# Patient Record
Sex: Female | Born: 1955 | Hispanic: No | Marital: Single | Smoking: Current every day smoker
Health system: Southern US, Community
[De-identification: ages and names within clinical notes are randomized; demographics above are authoritative.]

## PROBLEM LIST (undated history)

## (undated) DIAGNOSIS — J019 Acute sinusitis, unspecified: Secondary | ICD-10-CM

## (undated) DIAGNOSIS — D239 Other benign neoplasm of skin, unspecified: Secondary | ICD-10-CM

## (undated) DIAGNOSIS — R5381 Other malaise: Secondary | ICD-10-CM

## (undated) DIAGNOSIS — I10 Essential (primary) hypertension: Secondary | ICD-10-CM

## (undated) DIAGNOSIS — F17200 Nicotine dependence, unspecified, uncomplicated: Secondary | ICD-10-CM

## (undated) DIAGNOSIS — L578 Other skin changes due to chronic exposure to nonionizing radiation: Secondary | ICD-10-CM

## (undated) DIAGNOSIS — R7611 Nonspecific reaction to tuberculin skin test without active tuberculosis: Secondary | ICD-10-CM

## (undated) DIAGNOSIS — R5383 Other fatigue: Secondary | ICD-10-CM

## (undated) DIAGNOSIS — F4323 Adjustment disorder with mixed anxiety and depressed mood: Secondary | ICD-10-CM

## (undated) HISTORY — DX: Nicotine dependence, unspecified, uncomplicated: F17.200

## (undated) HISTORY — DX: Adjustment disorder with mixed anxiety and depressed mood: F43.23

## (undated) HISTORY — DX: Other benign neoplasm of skin, unspecified: D23.9

## (undated) HISTORY — DX: Essential (primary) hypertension: I10

## (undated) HISTORY — DX: Nonspecific reaction to tuberculin skin test without active tuberculosis: R76.11

## (undated) HISTORY — DX: Other malaise: R53.81

## (undated) HISTORY — DX: Other skin changes due to chronic exposure to nonionizing radiation: L57.8

## (undated) HISTORY — DX: Other fatigue: R53.83

## (undated) HISTORY — DX: Acute sinusitis, unspecified: J01.90

---

## 1985-01-02 DIAGNOSIS — R7611 Nonspecific reaction to tuberculin skin test without active tuberculosis: Secondary | ICD-10-CM

## 1985-01-02 HISTORY — PX: ABDOMINAL HYSTERECTOMY: SHX81

## 1985-01-02 HISTORY — DX: Nonspecific reaction to tuberculin skin test without active tuberculosis: R76.11

## 2000-06-11 ENCOUNTER — Encounter: Payer: Self-pay | Admitting: Pulmonary Disease

## 2000-09-28 ENCOUNTER — Encounter: Payer: Self-pay | Admitting: Pulmonary Disease

## 2009-06-18 ENCOUNTER — Ambulatory Visit: Payer: Self-pay | Admitting: Family Medicine

## 2009-06-18 DIAGNOSIS — J019 Acute sinusitis, unspecified: Secondary | ICD-10-CM

## 2009-06-18 DIAGNOSIS — I10 Essential (primary) hypertension: Secondary | ICD-10-CM | POA: Insufficient documentation

## 2009-06-22 DIAGNOSIS — R0609 Other forms of dyspnea: Secondary | ICD-10-CM

## 2009-06-22 DIAGNOSIS — G4710 Hypersomnia, unspecified: Secondary | ICD-10-CM

## 2009-06-22 DIAGNOSIS — R0989 Other specified symptoms and signs involving the circulatory and respiratory systems: Secondary | ICD-10-CM

## 2009-06-24 ENCOUNTER — Telehealth (INDEPENDENT_AMBULATORY_CARE_PROVIDER_SITE_OTHER): Payer: Self-pay | Admitting: Family Medicine

## 2009-07-30 ENCOUNTER — Ambulatory Visit: Payer: Self-pay | Admitting: Family Medicine

## 2009-07-30 DIAGNOSIS — R5383 Other fatigue: Secondary | ICD-10-CM

## 2009-07-30 DIAGNOSIS — D239 Other benign neoplasm of skin, unspecified: Secondary | ICD-10-CM | POA: Insufficient documentation

## 2009-07-30 DIAGNOSIS — R5381 Other malaise: Secondary | ICD-10-CM

## 2009-07-30 DIAGNOSIS — F4323 Adjustment disorder with mixed anxiety and depressed mood: Secondary | ICD-10-CM

## 2009-07-30 DIAGNOSIS — F17200 Nicotine dependence, unspecified, uncomplicated: Secondary | ICD-10-CM

## 2009-07-30 DIAGNOSIS — L578 Other skin changes due to chronic exposure to nonionizing radiation: Secondary | ICD-10-CM | POA: Insufficient documentation

## 2009-08-02 ENCOUNTER — Encounter: Payer: Self-pay | Admitting: Family Medicine

## 2009-08-02 LAB — CONVERTED CEMR LAB
BUN: 19 mg/dL (ref 6–23)
Basophils Absolute: 0 10*3/uL (ref 0.0–0.1)
Basophils Relative: 0.4 % (ref 0.0–3.0)
CO2: 28 meq/L (ref 19–32)
Calcium: 10.2 mg/dL (ref 8.4–10.5)
Chloride: 96 meq/L (ref 96–112)
Cholesterol: 220 mg/dL — ABNORMAL HIGH (ref 0–200)
Creatinine, Ser: 0.9 mg/dL (ref 0.4–1.2)
Direct LDL: 107.2 mg/dL
Eosinophils Absolute: 0.1 10*3/uL (ref 0.0–0.7)
Eosinophils Relative: 0.6 % (ref 0.0–5.0)
GFR calc non Af Amer: 72 mL/min (ref >60)
Glucose, Bld: 104 mg/dL — ABNORMAL HIGH (ref 70–99)
HCT: 41.3 % (ref 36.0–46.0)
HDL: 92.4 mg/dL (ref >39.00)
Hemoglobin: 14 g/dL (ref 12.0–15.0)
Lymphocytes Relative: 16.2 % (ref 12.0–46.0)
Lymphs Abs: 1.7 10*3/uL (ref 0.7–4.0)
MCHC: 33.9 g/dL (ref 30.0–36.0)
MCV: 93.3 fL (ref 78.0–100.0)
Monocytes Absolute: 0.7 10*3/uL (ref 0.1–1.0)
Monocytes Relative: 7.1 % (ref 3.0–12.0)
Neutro Abs: 7.8 10*3/uL — ABNORMAL HIGH (ref 1.4–7.7)
Neutrophils Relative %: 75.7 % (ref 43.0–77.0)
Platelets: 316 10*3/uL (ref 150.0–400.0)
Potassium: 3.9 meq/L (ref 3.5–5.1)
RBC: 4.43 M/uL (ref 3.87–5.11)
RDW: 14.2 % (ref 11.5–14.6)
Sodium: 130 meq/L — ABNORMAL LOW (ref 135–145)
TSH: 0.56 microintl units/mL (ref 0.35–5.50)
Total CHOL/HDL Ratio: 2
Triglycerides: 154 mg/dL — ABNORMAL HIGH (ref 0.0–149.0)
VLDL: 30.8 mg/dL (ref 0.0–40.0)
WBC: 10.3 10*3/uL (ref 4.5–10.5)

## 2009-08-04 ENCOUNTER — Ambulatory Visit: Payer: Self-pay | Admitting: Family Medicine

## 2009-08-05 LAB — CONVERTED CEMR LAB
BUN: 10 mg/dL (ref 6–23)
CO2: 29 meq/L (ref 19–32)
Calcium: 9.8 mg/dL (ref 8.4–10.5)
Chloride: 93 meq/L — ABNORMAL LOW (ref 96–112)
Creatinine, Ser: 0.6 mg/dL (ref 0.4–1.2)
GFR calc non Af Amer: 112.72 mL/min (ref >60)
Glucose, Bld: 91 mg/dL (ref 70–99)
Potassium: 3.6 meq/L (ref 3.5–5.1)
Sodium: 134 meq/L — ABNORMAL LOW (ref 135–145)

## 2009-08-17 ENCOUNTER — Telehealth: Payer: Self-pay | Admitting: Family Medicine

## 2009-08-18 ENCOUNTER — Ambulatory Visit: Payer: Self-pay | Admitting: Pulmonary Disease

## 2009-08-18 DIAGNOSIS — G4733 Obstructive sleep apnea (adult) (pediatric): Secondary | ICD-10-CM | POA: Insufficient documentation

## 2009-08-24 ENCOUNTER — Encounter: Payer: Self-pay | Admitting: Family Medicine

## 2009-08-24 ENCOUNTER — Ambulatory Visit: Payer: Self-pay | Admitting: Family Medicine

## 2009-08-26 LAB — HM MAMMOGRAPHY: HM Mammogram: NORMAL

## 2009-09-15 ENCOUNTER — Telehealth: Payer: Self-pay | Admitting: Family Medicine

## 2009-09-24 ENCOUNTER — Telehealth (INDEPENDENT_AMBULATORY_CARE_PROVIDER_SITE_OTHER): Payer: Self-pay | Admitting: *Deleted

## 2009-09-24 ENCOUNTER — Ambulatory Visit: Payer: Self-pay | Admitting: Pulmonary Disease

## 2009-09-27 ENCOUNTER — Telehealth: Payer: Self-pay | Admitting: Family Medicine

## 2009-09-27 DIAGNOSIS — R197 Diarrhea, unspecified: Secondary | ICD-10-CM | POA: Insufficient documentation

## 2009-10-13 ENCOUNTER — Telehealth: Payer: Self-pay | Admitting: Family Medicine

## 2009-11-22 ENCOUNTER — Telehealth: Payer: Self-pay | Admitting: Family Medicine

## 2009-11-30 ENCOUNTER — Encounter: Payer: Self-pay | Admitting: Family Medicine

## 2009-12-06 ENCOUNTER — Ambulatory Visit: Payer: Self-pay | Admitting: Family Medicine

## 2009-12-13 ENCOUNTER — Ambulatory Visit: Payer: Self-pay | Admitting: Family Medicine

## 2009-12-13 ENCOUNTER — Encounter: Payer: Self-pay | Admitting: Family Medicine

## 2009-12-13 DIAGNOSIS — K219 Gastro-esophageal reflux disease without esophagitis: Secondary | ICD-10-CM | POA: Insufficient documentation

## 2009-12-28 LAB — CONVERTED CEMR LAB
BUN: 17 mg/dL (ref 6–23)
Basophils Absolute: 0 10*3/uL (ref 0.0–0.1)
CO2: 27 meq/L (ref 19–32)
Chloride: 100 meq/L (ref 96–112)
Cholesterol: 252 mg/dL — ABNORMAL HIGH (ref 0–200)
Creatinine, Ser: 0.8 mg/dL (ref 0.4–1.2)
Direct LDL: 122.5 mg/dL
Eosinophils Relative: 1.1 % (ref 0.0–5.0)
HCT: 40.4 % (ref 36.0–46.0)
Hemoglobin: 13.9 g/dL (ref 12.0–15.0)
Lymphocytes Relative: 14.3 % (ref 12.0–46.0)
Lymphs Abs: 1.4 10*3/uL (ref 0.7–4.0)
Monocytes Relative: 5.9 % (ref 3.0–12.0)
Platelets: 333 10*3/uL (ref 150.0–400.0)
Potassium: 4.3 meq/L (ref 3.5–5.1)
RDW: 14.1 % (ref 11.5–14.6)
Total CHOL/HDL Ratio: 3
VLDL: 20 mg/dL (ref 0.0–40.0)
WBC: 10.1 10*3/uL (ref 4.5–10.5)

## 2010-01-04 ENCOUNTER — Telehealth: Payer: Self-pay | Admitting: Family Medicine

## 2010-01-10 ENCOUNTER — Other Ambulatory Visit: Payer: Self-pay | Admitting: Family Medicine

## 2010-01-10 ENCOUNTER — Ambulatory Visit
Admission: RE | Admit: 2010-01-10 | Discharge: 2010-01-10 | Payer: Self-pay | Source: Home / Self Care | Attending: Family Medicine | Admitting: Family Medicine

## 2010-01-11 LAB — BASIC METABOLIC PANEL
BUN: 22 mg/dL (ref 6–23)
CO2: 28 mEq/L (ref 19–32)
Calcium: 9.7 mg/dL (ref 8.4–10.5)
Chloride: 99 mEq/L (ref 96–112)
Creatinine, Ser: 0.8 mg/dL (ref 0.4–1.2)
GFR: 74.85 mL/min (ref >60.00)
Glucose, Bld: 95 mg/dL (ref 70–99)
Potassium: 4.1 mEq/L (ref 3.5–5.1)
Sodium: 137 mEq/L (ref 135–145)

## 2010-01-11 LAB — B12 AND FOLATE PANEL
Folate: 2.6 ng/mL
Vitamin B-12: 289 pg/mL (ref 211–911)

## 2010-01-11 LAB — FOLLICLE STIMULATING HORMONE: FSH: 41.8 m[IU]/mL

## 2010-01-11 LAB — LUTEINIZING HORMONE: LH: 29.9 m[IU]/mL

## 2010-01-11 LAB — TSH: TSH: 0.83 u[IU]/mL (ref 0.35–5.50)

## 2010-01-31 ENCOUNTER — Encounter: Payer: Self-pay | Admitting: Family Medicine

## 2010-01-31 ENCOUNTER — Ambulatory Visit: Payer: Self-pay | Admitting: Unknown Physician Specialty

## 2010-02-01 NOTE — Medication Information (Signed)
Summary: CPAP Titration/Our [facility]  CPAP Titration/Our [facility]   Imported By: Sherian Rein 09/02/2009 11:58:58  _____________________________________________________________________  External Attachment:    Type:   Image     Comment:   External Document

## 2010-02-01 NOTE — Progress Notes (Signed)
Summary: faxed request for alprazolam  Phone Note Refill Request   Refills Requested: Medication #1:  ALPRAZOLAM 0.25 MG TABS 1 tab by mouth three times a day as needed anxiety.   Last Refilled: 09/15/2009 Faxed request from [place], [phone]  Initial call taken by: Lowella Petties CMA,  October 13, 2009 2:46 PM  Follow-up for Phone Call        Rx called to pharmacy Follow-up by: Linde Gillis CMA Duncan Dull),  October 14, 2009 9:07 AM    Prescriptions: ALPRAZOLAM 0.25 MG TABS (ALPRAZOLAM) 1 tab by mouth three times a day as needed anxiety.  #60 x 0   Entered and Authorized by:   Ruthe Mannan MD   Signed by:   Ruthe Mannan MD on 10/14/2009   Method used:   Telephoned to ...       [facility] [address] (retail)       [address], [address]       [place], [place]  [address]       Ph: [phone]       Fax: [phone]   RxID:   [id number]

## 2010-02-01 NOTE — Assessment & Plan Note (Signed)
Summary: FOLLOW-UP, TALK ABOUT B/P MEDS AND LABS/JRR   Vital Signs:  Patient profile:   55 year old female Height:      70 inches Weight:      186.75 pounds BMI:     26.89 Temp:     98.0 degrees F oral Pulse rate:   72 / minute Pulse rhythm:   regular BP sitting:   150 / 90  (left arm) Cuff size:   regular  Vitals Entered By: Linde Gillis CMA Duncan Dull) (December 06, 2009 3:43 PM) CC: discuss blood pressure medication and labs   History of Present Illness: 55 yo here to discuss:  HTN- Has been on HCTZ/Lisinopril for years.  No CP, SOB, LE edema.  No CP or blurred vision but over past several months, BP has been increasing.  Was 130/90 in 07/2009. Was in 150s- 160s/90s for past several months. Has also been having a dry cough and wants to try something else than ACEI to see if that could be related. She is a chronic smoker as well.     Tobacco abuse- has smoked 1 ppd x 40 years.  Never tried to quit and is not ready to quit yet.    Current Medications (verified): 1)  Ibuprofen 800 Mg Tabs (Ibuprofen) .... Take One To Two Tablets By Mouth As Needed 2)  Premarin 0.9 Mg Tabs (Estrogens Conjugated) .... Take One Tablet By Mouth Daily 3)  Fluoxetine Hcl 20 Mg Caps (Fluoxetine Hcl) .... Take 3 Tabs By Mouth Daily 4)  Vesicare 5 Mg Tabs (Solifenacin Succinate) .... Take One Tablet By Mouth Daily 5)  Aspir-Low 81 Mg Tbec (Aspirin) .... Take One Tablet By Mouth Daily 6)  Alprazolam 0.25 Mg Tabs (Alprazolam) .Marland Kitchen.. 1 Tab By Mouth Three Times A Day As Needed Anxiety. 7)  Nuvigil 50 Mg Tabs (Armodafinil) .... Take One Tablet By Mouth Daily 8)  Omeprazole 20 Mg Cpdr (Omeprazole) .... Take One Tablet By Mouth Daily 9)  Amlodipine Besylate 5 Mg Tabs (Amlodipine Besylate) .Marland Kitchen.. 1 Tab By Mouth Daily. 10)  Hydrochlorothiazide 25 Mg  Tabs (Hydrochlorothiazide) .... Take 1 Tab By Mouth Every Morning  Allergies (verified): No Known Drug Allergies  Review of Systems      See HPI General:  Denies  malaise. Eyes:  Denies blurring. CV:  Denies chest pain or discomfort.  Physical Exam  General:  Well-developed,well-nourished,in no acute distress; alert,appropriate and cooperative throughout examination hypertensive Eyes:  vision grossly intact, pupils equal, pupils round, and pupils reactive to light.   Mouth:  good dentition.   Lungs:  Normal respiratory effort, chest expands symmetrically. Lungs are clear to auscultation, no crackles or wheezes. Heart:  Normal rate and regular rhythm. S1 and S2 normal without gallop, murmur, click, rub or other extra sounds. Extremities:  No clubbing, cyanosis, edema, or deformity noted with normal full range of motion of all joints.   Psych:  Oriented X3, memory intact for recent and remote, good eye contact, not anxious appearing, and not depressed appearing.     Impression & Recommendations:  Problem # 1:  HYPERTENSION (ICD-401.9) Assessment Deteriorated Will d/c ACEI due to cough. Restart HCTZ 25 mg and add amlodipine 5 mg. Check BMET, follow up in one month. The following medications were removed from the medication list:    Lisinopril-hydrochlorothiazide 20-25 Mg Tabs (Lisinopril-hydrochlorothiazide) .Marland Kitchen... Take one tablet by mouth daily Her updated medication list for this problem includes:    Amlodipine Besylate 5 Mg Tabs (Amlodipine besylate) .Marland Kitchen... 1 tab  by mouth daily.    Hydrochlorothiazide 25 Mg Tabs (Hydrochlorothiazide) .Marland Kitchen... Take 1 tab by mouth every morning  Complete Medication List: 1)  Ibuprofen 800 Mg Tabs (Ibuprofen) .... Take one to two tablets by mouth as needed 2)  Premarin 0.9 Mg Tabs (Estrogens conjugated) .... Take one tablet by mouth daily 3)  Fluoxetine Hcl 20 Mg Caps (Fluoxetine hcl) .... Take 3 tabs by mouth daily 4)  Vesicare 5 Mg Tabs (Solifenacin succinate) .... Take one tablet by mouth daily 5)  Aspir-low 81 Mg Tbec (Aspirin) .... Take one tablet by mouth daily 6)  Alprazolam 0.25 Mg Tabs (Alprazolam) .Marland Kitchen.. 1  tab by mouth three times a day as needed anxiety. 7)  Nuvigil 50 Mg Tabs (Armodafinil) .... Take one tablet by mouth daily 8)  Omeprazole 20 Mg Cpdr (Omeprazole) .... Take one tablet by mouth daily 9)  Amlodipine Besylate 5 Mg Tabs (Amlodipine besylate) .Marland Kitchen.. 1 tab by mouth daily. 10)  Hydrochlorothiazide 25 Mg Tabs (Hydrochlorothiazide) .... Take 1 tab by mouth every morning  Patient Instructions: 1)  Please stop taking the Lisinopril-HCTZ pill and start taking the HCTZ and Amlodipine. 2)  Follow up with me in one month. 3)  Schedule a fasting lab visit for labs: 4)  H. Pylori IgG (530.81), BMET (401.9), fasting lipid (V81.0), CBC (780.79) Prescriptions: HYDROCHLOROTHIAZIDE 25 MG  TABS (HYDROCHLOROTHIAZIDE) Take 1 tab by mouth every morning  #90 x 3   Entered and Authorized by:   Ruthe Mannan MD   Signed by:   Ruthe Mannan MD on 12/06/2009   Method used:   Faxed to ...       [facility] ([facility]order)       [address] [place], [place]  [address]       Ph: [phone]       Fax: [phone]   RxID:   [id number] HYDROCHLOROTHIAZIDE 25 MG  TABS (HYDROCHLOROTHIAZIDE) Take 1 tab by mouth every morning  #90 x 3   Entered and Authorized by:   Ruthe Mannan MD   Signed by:   Ruthe Mannan MD on 12/06/2009   Method used:   Electronically to        [facility] [address] (retail)       [address]       [place], [place]  [address]       Ph: [phone]       Fax: [phone]   RxID:   [id number] AMLODIPINE BESYLATE 5 MG TABS (AMLODIPINE BESYLATE) 1 tab by mouth daily.  #30 x 3   Entered and Authorized by:   Ruthe Mannan MD   Signed by:   Ruthe Mannan MD on 12/06/2009   Method used:   Electronically to        [facility] [address]ail[facility]       [address], [address]       [place], [place]  [address]       Ph: [phone]       Fax: [phone]   RxID:   [id number] AMLODIPINE BESYLATE  5 MG TABS (AMLODIPINE BESYLATE) 1 tab by mouth daily.  #90 x 3   Entered and Authorized by:   Ruthe Mannan MD   Signed by:   Ruthe Mannan MD on 12/06/2009   Method used:   Faxed to [facility] (mail-order)       [address] [place], [place]  [address]       Ph: [phone]       Fax: [phone]   RxID:   [id number]    Orders Added: 1)  Est. Patient Level IV [84132]    Current Allergies (reviewed today): No known allergies   Appended Document: referral to sleep lab

## 2010-02-01 NOTE — Progress Notes (Signed)
Summary: refill request for alprazolam  Phone Note Refill Request Message from:  Fax from Pharmacy  Refills Requested: Medication #1:  ALPRAZOLAM 0.25 MG TABS 1 tab by mouth three times a day as needed anxiety..   Last Refilled: 07/30/2009 Faxed request from [place], [phone].  Initial call taken by: Lowella Petties CMA,  August 17, 2009 8:17 AM  Follow-up for Phone Call        Rx called to pharmacy Follow-up by: Linde Gillis CMA Duncan Dull),  August 17, 2009 9:48 AM    Prescriptions: ALPRAZOLAM 0.25 MG TABS (ALPRAZOLAM) 1 tab by mouth three times a day as needed anxiety.  #60 x 0   Entered and Authorized by:   Ruthe Mannan MD   Signed by:   Ruthe Mannan MD on 08/17/2009   Method used:   Telephoned to ...         RxID:   [id number]

## 2010-02-01 NOTE — Progress Notes (Signed)
Summary: F/u sinusitis  Phone Note Call from Patient   Caller: Patient Summary of Call: Sherry Beard called states she was seen Frday a week ago and was given medication, took the medication and was feeling better but now the symptoms of a sinus infection are coming back.    Initial call taken by: Rosine Beat,  June 24, 2009 11:02 AM  Follow-up for Phone Call        Will call in Abx for patient.     New/Updated Medications: AMOXICILLIN 500 MG TABS (AMOXICILLIN) 1 by mouth two times a day x 10 days for infection Prescriptions: AMOXICILLIN 500 MG TABS (AMOXICILLIN) 1 by mouth two times a day x 10 days for infection  #20 x 0   Entered and Authorized by:   Tacey Ruiz MD   Signed by:   Tacey Ruiz MD on 06/24/2009   Method used:   Electronically to        [facility] [address]ail)       [address]       [place], [place]  [address]       Ph: [phone]       Fax: [phone]   RxID:   [id number]

## 2010-02-01 NOTE — Assessment & Plan Note (Signed)
Summary: consult for management of osa   Copy to:  Sherry Beard Primary Sherry Beard/Referring Sherry Beard:  Sherry Mannan MD  CC:  Sleep Consult.  History of Present Illness: The pt is a 55 y/o female who I have been asked to see for management of osa.  She was diagnosed in 05-22-2000 while living in [place] with mild osa.  Her AHI was 14/hr, and her optimal cpap was found to be 7cm.  She did feel that she slept better on the device, but it was a major aggrevation to her.  She c/o significant mask leaks, but pressure was not an issue.  Her machine was left behind in [place], but was almost 55y/o anyway.  Currently, she has loud snoring, but no one has commented on pauses in her breathing during sleep.  She denies choking arousals.  She goes to bed btw 9-11pm, and arises at 8-10am.  She is unrested at least 50% of the time.  She also has frequent awakenings during the night.  She admits to sleep pressure with periods of inactivity, and takes a nap everyday.  On some days she sleeps quite a bit.  She denies any sleepiness issues with driving.  The pt states that her weight is up about 30 pounds the last 12mos.  Her epworth score today is normal at 8.  Current Medications (verified): 1)  Ibuprofen 800 Mg Tabs (Ibuprofen) .... Take One To Two Tablets By Mouth As Needed 2)  Lisinopril-Hydrochlorothiazide 20-25 Mg Tabs (Lisinopril-Hydrochlorothiazide) .... Take One Tablet By Mouth Daily 3)  Premarin 0.9 Mg Tabs (Estrogens Conjugated) .... Take One Tablet By Mouth Daily 4)  Fluoxetine Hcl 20 Mg Caps (Fluoxetine Hcl) .... Take 3 Tabs By Mouth Daily 5)  Vesicare 5 Mg Tabs (Solifenacin Succinate) .... Take One Tablet By Mouth Daily 6)  Aspir-Low 81 Mg Tbec (Aspirin) .... Take One Tablet By Mouth Daily 7)  Alprazolam 0.25 Mg Tabs (Alprazolam) .Marland Kitchen.. 1 Tab By Mouth Three Times A Day As Needed Anxiety.  Allergies (verified): No Known Drug Allergies  Past History:  Past Medical History:   OSA--AHI 14/hr 2000-05-22. 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 Hysterectomy (12/03/2006) benign  Family History: Reviewed history from 07/30/2009 and no changes required. Mom died of 68 of Melanoma Dad alive- HTN, HLD, heart disease  Social History: Reviewed history from 07/30/2009 and no changes required. Current Smoker -1 ppd.  started at age 22. Moved here from [place]. Daughter died of drug OD in 2006-05-23. 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   Review of Systems       The patient complains of shortness of breath with activity, productive cough, weight change, tooth/dental problems, sneezing, anxiety, and depression.  The patient denies shortness of breath at rest, non-productive cough, coughing up blood, chest pain, irregular heartbeats, acid heartburn, indigestion, loss of appetite, abdominal pain, difficulty swallowing, sore throat, headaches, nasal congestion/difficulty breathing through nose, itching, ear ache, hand/feet swelling, joint stiffness or pain, rash, change in color of mucus, and fever.    Vital Signs:  Patient profile:   55 year old female Height:      70 inches Weight:      180 pounds BMI:     25.92 O2 Sat:      97 % on  Room air Temp:     98.5 degrees F oral Pulse rate:   65 / minute BP sitting:   122 / 78  (left arm) Cuff size:   large  Vitals Entered By: Sherry Filter LPN (August 18, 2009 10:10 AM)  O2 Flow:  Room air CC: Sleep Consult Comments Medications reviewed with patient Sherry Filter LPN  August 18, 2009 10:10 AM    Physical Exam  General:  ow female in nad Eyes:  PERRLA and EOMI.   Nose:  narrowed bilat, but not obstructed Mouth:  moderate elongation of soft palate and  uvula Neck:  no jvd, tmg, LN Lungs:  totally clear to auscultation Heart:  rrr, no mrg Abdomen:  soft and nontender, bs+ Extremities:  no edema noted, pulses intact distally no cyanosis  Neurologic:  alert and oriented, moves all 4.   Impression & Recommendations:  Problem # 1:  OBSTRUCTIVE SLEEP APNEA (ICD-327.23) the pt has a history of mild to moderate osa, and has actually gained 30 pounds since that study.  Her degree of sleep apnea is more than likely worse.  She is clearly symptomatic off treatment, and is willing to try cpap again.  I think we have to do a better job with mask fitting, since that seemed to be the "deal breaker" last time.  I have also discussed with her the alternatives such as upper airway surgery and dental appliance.  I would favor the latter if cpap doesn't work out.  I have also encouraged her to work on weight loss.    Medications Added to Medication List This Visit: 1)  Fluoxetine Hcl 20 Mg Caps (Fluoxetine hcl) .... Take 3 tabs by mouth daily  Other Orders: Consultation Level IV ([address]) DME Referral (DME)  Patient Instructions: 1)  will set you up on cpap 2)  work on weight loss. 3)  followup with me in 5 weeks.

## 2010-02-01 NOTE — Progress Notes (Signed)
Summary: GI tests?-LMTCB x 2  Phone Note Call from Patient Call back at Home Phone [phone]   Caller: Patient Call For: clance Summary of Call: pt states that she was told that someone would set up "upper and lower GI tests" for her. (pt was seen today) Initial call taken by: Tivis Ringer, CNA,  September 24, 2009 4:13 PM  Follow-up for Phone Call        pt states that she discussed with Dr. Shelle Iron about him ordering an "upper and lower GI" test because she has met her deductible on her insurance. PT states that [facility] had said he would send order to [facility]. Pelase advise. Carron Curie CMA  September 24, 2009 4:35 PM   Additional Follow-up for Phone Call Additional follow up Details #1::        we did NOT discuss gi evaluation.  She told me she had a tickling cough, and I told her she needed to talk to her primary about coming off lisinopril.  She could also try chlorpheniramine for PND.  nothing was discussed about gi anything!!! she needs to see her primary!!! Additional Follow-up by: Barbaraann Share MD,  September 24, 2009 5:25 PM    Additional Follow-up for Phone Call Additional follow up Details #2::    LMTCBx1. to advise pt needs to call PMD. Carron Curie [facility]  September 24, 2009 5:30 PM  LMOMTCB Vernie Murders  September 27, 2009 10:42 AM    Additional Follow-up for Phone Call Additional follow up Details #3:: Details for Additional Follow-up Action Taken: Spoke with pt and advised that she needs to discuss GI issues with her PCP.  Pt verbalized understanding. Additional Follow-up by: Vernie Murders,  September 27, 2009 10:57 AM

## 2010-02-01 NOTE — Progress Notes (Signed)
Summary: refill request for alprazolam  Phone Note Refill Request Message from:  Fax from Pharmacy  Refills Requested: Medication #1:  ALPRAZOLAM 0.25 MG TABS 1 tab by mouth three times a day as needed anxiety..   Last Refilled: 08/17/2009 Faxed request from [place].  Initial call taken by: Lowella Petties CMA,  September 15, 2009 10:02 AM  Follow-up for Phone Call        Rx called to [facility]. Follow-up by: Linde Gillis CMA Duncan Dull),  September 15, 2009 10:13 AM    Prescriptions: ALPRAZOLAM 0.25 MG TABS (ALPRAZOLAM) 1 tab by mouth three times a day as needed anxiety.  #60 x 0   Entered and Authorized by:   Ruthe Mannan MD   Signed by:   Ruthe Mannan MD on 09/15/2009   Method used:   Telephoned to ...       [facility] [address] (retail)       [address], [address]       [place], [place]  [address]       Ph: [phone]       Fax: [phone]   RxID:   [id number]

## 2010-02-01 NOTE — Assessment & Plan Note (Signed)
Summary: SINUS INFECTION/JBB   Vital Signs:  Patient Profile:   55 Years Old Female CC:      Sinus pain, With headache. / RWT Height:     68 inches Weight:      181 pounds BMI:     27.62 O2 Sat:      98 % O2 treatment:    Room Air Temp:     98.1 degrees F oral Pulse rate:   77 / minute Pulse rhythm:   regular Resp:     18 per minute BP sitting:   159 / 88  (left arm)  Pt. in pain?   yes    Location:   head    Intensity:   6    Type:       aching  Vitals Entered By: Levonne Spiller EMT-P (June 18, 2009 3:20 PM)              Is Patient Diabetic? No Comments Pt. is a smoker.1 pack per day.      Current Allergies: No known allergies History of Present Illness Chief Complaint: Sinus pain, With headache. / RWT History of Present Illness: 3 days patient has been having pain in her upper and lower jaw and R ear. Her nose has been running a lot and she has a dry hacking cough. She last went to the dentist about 2 weeks ago and had no problems then. No f/c.   She also complains of headaches and has episodes of tingling down both arms. No neck pain. Reports that she has been gardening more lately.  REVIEW OF SYSTEMS Constitutional Symptoms      Denies fever, chills, night sweats, weight loss, weight gain, and fatigue.  Eyes       Denies change in vision, eye pain, eye discharge, glasses, contact lenses, and eye surgery. Ear/Nose/Throat/Mouth       Complains of ear pain, frequent runny nose, sinus problems, and tooth pain or bleeding.      Denies hearing loss/aids, change in hearing, ear discharge, dizziness, frequent nose bleeds, sore throat, and hoarseness.  Respiratory       Complains of dry cough.      Denies productive cough, wheezing, shortness of breath, asthma, bronchitis, and emphysema/COPD.  Cardiovascular       Denies murmurs, chest pain, and tires easily with exhertion.    Gastrointestinal       Denies stomach pain, nausea/vomiting, diarrhea, constipation, blood in  bowel movements, and indigestion. Genitourniary       Denies painful urination, kidney stones, and loss of urinary control. Neurological       Complains of headaches and tingling.      Denies paralysis, seizures, and fainting/blackouts. Musculoskeletal       Denies muscle pain, joint pain, joint stiffness, decreased range of motion, redness, swelling, muscle weakness, and gout.  Skin       Denies bruising, unusual mles/lumps or sores, and hair/skin or nail changes.  Psych       Complains of depression.      Denies mood changes, temper/anger issues, anxiety/stress, speech problems, and sleep problems. Blood-Lymph       Complains of easily bruises or bleeds.  Past History:  Past Medical History: Hypertension  Past Surgical History: Hysterectomy (12/03/2006)  Social History: Current Smoker - 40 pack year history Smoking Status:  current Physical Exam General appearance: well developed, well nourished, no acute distress Ears: normal, no lesions or deformities Nasal: swollen red turbinates with congestion Oral/Pharynx:  tongue normal, posterior pharynx without erythema or exudate Neck: supple,tender anterior, preauricular and maxillary lymphadenopathy present, + TMJ bilateral click appreciated Chest/Lungs: no rales, wheezes, or rhonchi bilateral, breath sounds equal without effort Heart: regular rate and  rhythm, no murmur Skin: no obvious rashes or lesions MSE: oriented to time, place, and person No cervical vertebral tenderness to palpation.  nontender cervical muscles.   + tender right maxillary sinuses, no erythema.                       Assessment New Problems: ACUTE SINUSITIS, UNSPECIFIED (ICD-461.9) HYPERTENSION (ICD-401.9)   Plan New Medications/Changes: PREDNISONE 20 MG TABS (PREDNISONE) 1 by mouth two times a day x 4 days  #8 x 0, 06/18/2009, Tacey Ruiz MD  New Orders: New Patient Level III [phone]  The patient and/or caregiver has been counseled thoroughly with  regard to medications prescribed including dosage, schedule, interactions, rationale for use, and possible side effects and they verbalize understanding.  Diagnoses and expected course of recovery discussed and will return if not improved as expected or if the condition worsens. Patient and/or caregiver verbalized understanding.  Prescriptions: PREDNISONE 20 MG TABS (PREDNISONE) 1 by mouth two times a day x 4 days  #8 x 0   Entered and Authorized by:   Tacey Ruiz MD   Signed by:   Tacey Ruiz MD on 06/18/2009   Method used:   Electronically to        [facility] [address]ail[facility]       [address], [address]       [place], [place]  [address]       Ph: [phone]       Fax: [phone]   RxID:   [id number]   Patient Instructions: 1)  Acute sinusitis symptoms for less than 10 days are not helped by antibiotics.Use warm moist compresses, and over the counter decongestants ( only as directed). Call if no improvement in 5-7 days, sooner if increasing pain, fever, or new symptoms.  Orders Added: 1)  New Patient Level III [35[address]]

## 2010-02-01 NOTE — Progress Notes (Signed)
Summary: alprazolam   Phone Note Refill Request Message from:  Fax from Pharmacy on November 22, 2009 9:42 AM  Refills Requested: Medication #1:  ALPRAZOLAM 0.25 MG TABS 1 tab by mouth three times a day as needed anxiety.   Last Refilled: 10/14/2009 Refill request from [place]. [phone].   Initial call taken by: Melody Comas,  November 22, 2009 9:43 AM Caller: Patient Call For: Ruthe Mannan MD  Follow-up for Phone Call        Rx called to pharmacy Follow-up by: Linde Gillis CMA Duncan Dull),  November 22, 2009 10:02 AM    Prescriptions: ALPRAZOLAM 0.25 MG TABS (ALPRAZOLAM) 1 tab by mouth three times a day as needed anxiety.  #60 x 0   Entered and Authorized by:   Ruthe Mannan MD   Signed by:   Ruthe Mannan MD on 11/22/2009   Method used:   Telephoned to ...       [facility] [address]ail)       [address]       [place], [place]  [address]       Ph: [phone]       Fax: [phone]   RxID:   [id number]

## 2010-02-01 NOTE — Miscellaneous (Signed)
Summary: Orders Update  Clinical Lists Changes  Problems: Added new problem of Question of  SLEEP APNEA (ICD-780.57) Orders: Added new Referral order of Sleep Disorder Referral (Sleep Disorder) - Signed

## 2010-02-01 NOTE — Assessment & Plan Note (Signed)
Summary: rov for osa   Copy to:  Ruthe Mannan Primary Provider/Referring Provider:  Ruthe Mannan MD  CC:  Pt is here for a 5 week f/u on OSA.  Pt states she is wearing her cpap machine "almost every night."  Approx 8 hours per night.  Pt denied any complaints with mask or pressure.  Pt states her psychiatrist started her on Nuvigil 150mg .  .  History of Present Illness: The pt comes in today for f/u of her osa.  She was started on cpap last  visit at a moderate pressure, and has been compliant with her therapy.  She denies any issues with mask fit or pressure, and has seen definite improvement in her sleep.  She has also seen increased daytime alertness, but has been started on nuvigil by psychiatry as well.  She is having come cough at night which makes cpap an issue early in the night, but is describing postnasal drip as well.  She is also on an ACE.  Current Medications (verified): 1)  Ibuprofen 800 Mg Tabs (Ibuprofen) .... Take One To Two Tablets By Mouth As Needed 2)  Lisinopril-Hydrochlorothiazide 20-25 Mg Tabs (Lisinopril-Hydrochlorothiazide) .... Take One Tablet By Mouth Daily 3)  Premarin 0.9 Mg Tabs (Estrogens Conjugated) .... Take One Tablet By Mouth Daily 4)  Fluoxetine Hcl 20 Mg Caps (Fluoxetine Hcl) .... Take 3 Tabs By Mouth Daily 5)  Vesicare 5 Mg Tabs (Solifenacin Succinate) .... Take One Tablet By Mouth Daily 6)  Aspir-Low 81 Mg Tbec (Aspirin) .... Take One Tablet By Mouth Daily 7)  Alprazolam 0.25 Mg Tabs (Alprazolam) .Marland Kitchen.. 1 Tab By Mouth Three Times A Day As Needed Anxiety. 8)  Nuvigil 150 Mg Tabs (Armodafinil) .... Take 1 Tablet By Mouth Once A Day For 7 Days and Then Increase To 250mg  Daily  Allergies (verified): No Known Drug Allergies  Review of Systems       The patient complains of non-productive cough.  The patient denies shortness of breath with activity, shortness of breath at rest, productive cough, coughing up blood, chest pain, irregular heartbeats, acid heartburn,  indigestion, loss of appetite, weight change, abdominal pain, difficulty swallowing, sore throat, tooth/dental problems, headaches, nasal congestion/difficulty breathing through nose, sneezing, itching, ear ache, anxiety, depression, hand/feet swelling, joint stiffness or pain, rash, change in color of mucus, and fever.    Vital Signs:  Patient profile:   55 year old female Height:      70 inches Weight:      183.38 pounds BMI:     26.41 O2 Sat:      100 % on Room air Temp:     98.2 degrees F oral Pulse rate:   71 / minute BP sitting:   144 / 88  (left arm) Cuff size:   regular  Vitals Entered By: Arman Filter LPN (September 24, 2009 1:36 PM)  O2 Flow:  Room air CC: Pt is here for a 5 week f/u on OSA.  Pt states she is wearing her cpap machine "almost every night."  Approx 8 hours per night.  Pt denied any complaints with mask or pressure.  Pt states her psychiatrist started her on Nuvigil 150mg .   Comments Medications reviewed with patient Arman Filter LPN  September 24, 2009 1:38 PM    Physical Exam  General:  ow female in nad Nose:  no skin breakdown or pressure necrosis from cpap mask no crusting or purulence seen Extremities:  no edema or cyanosis Neurologic:  alert and oriented,  does not appear sleepy, moves all 4.   Impression & Recommendations:  Problem # 1:  OBSTRUCTIVE SLEEP APNEA (ICD-327.23) the pt is doing well with cpap this go around.  She is happy with her nasal pillows, and feels that she is sleeping better with the device.  Her daytime symptoms have improved as well, but she has been simultaneously started on nuvigil by her psychiatrist.  We have discussed pressure optimization, and the importance of weight loss. Care Plan:  At this point, will arrange for the patient's machine to be changed over to auto mode for 2 weeks to optimize their pressure.  I will review the downloaded data once sent by dme, and also evaluate for compliance, leaks, and residual osa.   I will call the patient and dme to discuss the results, and have the patient's machine set appropriately.  This will serve as the pt's cpap pressure titration.  Medications Added to Medication List This Visit: 1)  Nuvigil 150 Mg Tabs (Armodafinil) .... Take 1 tablet by mouth once a day for 7 days and then increase to 250mg  daily  Other Orders: Est. Patient Level III (44010) DME Referral (DME)  Patient Instructions: 1)  will get your pressure optimized for you on auto mode.  I will let you know the results. 2)  work on weight loss 3)  try chlorpheniramine 8mg  one at bedtime for postnasal drip, but your tickle may be due to your lisinopril.  Talk with primary md about coming off this for a trial period 4)  followup with me in 6mos if doing well.  Appended Document: rov for osa we have received pt's cpap download off machine and was put into your veyr important look at folder  Appended Document: rov for osa the download they sent was not an auto titrate download...just compliance.

## 2010-02-01 NOTE — Progress Notes (Signed)
Summary: wants referral to GI  Phone Note Call from Patient Call back at Home Phone [phone]   Caller: Patient Call For: Ruthe Mannan MD Summary of Call: Pt wants referral to GI, she is having some problems with diarrhea and constipation and wants to have an  upper and lower GI eval done.  She prefers to see something in [place]. Initial call taken by: Lowella Petties CMA,  September 27, 2009 11:05 AM  New Problems: DIARRHEA (ICD-787.91)   New Problems: DIARRHEA (ICD-787.91)

## 2010-02-01 NOTE — Assessment & Plan Note (Signed)
Summary: NEW PATIENT/[facility]   Vital Signs:  Patient profile:   55 year old female Height:      68 inches Weight:      181.25 pounds BMI:     27.66 Temp:     99.5 degrees F oral Pulse rate:   72 / minute Pulse rhythm:   regular BP sitting:   130 / 90  (left arm) Cuff size:   regular  Vitals Entered By: Linde Gillis CMA Duncan Dull) (08-19-2009 1:44 PM) CC: new patient, establish care   History of Present Illness: 55 yo here to establish care.  Depression/PTSD- daugher died of drug overdose in 2006-12-05.  Placed on Prozac and Abilify shortly after her death and saw a therapist for over a year.  Off of Abilify now, caused weight gain. Doing well but at times has hypervigilance, sadness and nightmares.  Sleeps too much at times. In a very good relationship with her high school sweetheart who is very supportive.  No SI or HI. Son is a drug addict as well and she is terrified that he will OD as well.  HTN- Has been on HCTZ/Lisinopril for years.  No CP, SOB, LE edema.  No CP or blurred vision.  Fatigue- feels it is likely due to her depression but at times she feels like she could sleep all day. Denies any symptoms of thyroid dysfunction.  Tobacco abuse- has smoked 1 ppd x 40 years.  Never tried to quit and is not ready to quit yet.  Multiple nevi- would like a derm referral for nevi as well as to evaluate for "sun damage."  Well woman- due for mammogram, FLP, BMET, tetanus.  Preventive Screening-Counseling & Management  Alcohol-Tobacco     Smoking Status: current  Current Medications (verified): 1)  Ibuprofen 800 Mg Tabs (Ibuprofen) .... Take One To Two Tablets By Mouth As Needed 2)  Lisinopril-Hydrochlorothiazide 20-25 Mg Tabs (Lisinopril-Hydrochlorothiazide) .... Take One Tablet By Mouth Daily 3)  Premarin 0.9 Mg Tabs (Estrogens Conjugated) .... Take One Tablet By Mouth Daily 4)  Fluoxetine Hcl 20 Mg Caps (Fluoxetine Hcl) .... Take One Tablet By Mouth Once Daily 5)  Vesicare 5 Mg  Tabs (Solifenacin Succinate) .... Take One Tablet By Mouth Daily 6)  Aspir-Low 81 Mg Tbec (Aspirin) .... Take One Tablet By Mouth Daily 7)  Alprazolam 0.25 Mg Tabs (Alprazolam) .Marland Kitchen.. 1 Tab By Mouth Three Times A Day As Needed Anxiety.  Allergies (verified): No Known Drug Allergies  Past History:  Past Medical History: Last updated: 06/18/2009 Hypertension  Family History: Last updated: 08/19/2009 Mom died of 10 of Melanoma Dad alive- HTN, HLD  Social History: Last updated: 08/19/09 Current Smoker - 40 pack year history Moved here from [place]. Daughter died of drug OD in June 04, 2006. Divorced Current Smoker  Risk Factors: Smoking Status: current (08/19/2009)  Past Surgical History: Hysterectomy (55/01/2006) benign  Family History: Mom died of 55 of Melanoma Dad alive- HTN, HLD  Social History: Current Smoker - 40 pack year history Moved here from [place]. Daughter died of drug OD in June 04, 2006. Divorced Current Smoker  Review of Systems      See HPI General:  Complains of fatigue and malaise; denies chills, fever, loss of appetite, weakness, and weight loss. Eyes:  Denies blurring. ENT:  Denies difficulty swallowing. CV:  Denies chest pain or discomfort and difficulty breathing at night. Resp:  Denies shortness of breath. GI:  Denies abdominal pain, bloody stools, and change in bowel habits. GU:  Denies abnormal  vaginal bleeding, discharge, and dysuria. MS:  Denies joint pain, joint redness, and joint swelling. Derm:  Complains of lesion(s); denies rash. Neuro:  Denies headaches, visual disturbances, and weakness. Psych:  Complains of anxiety, depression, and easily tearful; denies easily angered, mental problems, panic attacks, sense of great danger, suicidal thoughts/plans, thoughts of violence, unusual visions or sounds, and thoughts /plans of harming others. Endo:  Denies cold intolerance and heat intolerance. Heme:  Complains of abnormal bruising; denies bleeding.  Physical  Exam  General:  Well-developed,well-nourished,in no acute distress; alert,appropriate and cooperative throughout examination Head:  normocephalic, atraumatic, no abnormalities observed, and no abnormalities palpated.   Eyes:  vision grossly intact, pupils equal, pupils round, and pupils reactive to light.   Ears:  R ear normal and L ear normal.   Nose:  nose piercing noted.   Mouth:  good dentition.   Neck:  No deformities, masses, or tenderness noted. Lungs:  Normal respiratory effort, chest expands symmetrically. Lungs are clear to auscultation, no crackles or wheezes. Heart:  Normal rate and regular rhythm. S1 and S2 normal without gallop, murmur, click, rub or other extra sounds. Abdomen:  Bowel sounds positive,abdomen soft and non-tender without masses, organomegaly or hernias noted. Msk:  No deformity or scoliosis noted of thoracic or lumbar spine.   Extremities:  No clubbing, cyanosis, edema, or deformity noted with normal full range of motion of all joints.   Neurologic:  alert & oriented X3 and gait normal.   Skin:  Intact without suspicious lesions or rashes Psych:  Oriented X3, memory intact for recent and remote, good eye contact, not anxious appearing, and not depressed appearing.     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 Will refer to Dr. Sherrine Maples for psychotherapy. Continue Fluoxetine, Alprazolam as needed anxiety.  Problem # 2:  HYPERSOMNIA UNSPECIFIED (ICD-780.54) Assessment: Unchanged Likely related to #1.  Check TSH, CBC.  Problem # 3:  HYPERTENSION (ICD-401.9) Assessment: Unchanged Stable.  Continue current meds.  Check BMET today. Her updated medication list for this problem includes:    Lisinopril-hydrochlorothiazide 20-25 Mg Tabs (Lisinopril-hydrochlorothiazide) .Marland Kitchen... Take one tablet by mouth daily  Orders: TLB-BMP (Basic Metabolic  Panel-BMET) (80048-METABOL) TLB-TSH (Thyroid Stimulating Hormone) (84443-TSH)  Problem # 4:  Preventive Health Care (ICD-V70.0) Assessment: Comment Only Set up mammogram today. Tetanus given today. FLP today.  Problem # 5:  NEVUS (ICD-216.9)  Orders: Dermatology Referral (Derma)  Complete Medication List: 1)  Ibuprofen 800 Mg Tabs (Ibuprofen) .... Take one to two tablets by mouth as needed 2)  Lisinopril-hydrochlorothiazide 20-25 Mg Tabs (Lisinopril-hydrochlorothiazide) .... Take one tablet by mouth daily 3)  Premarin 0.9 Mg Tabs (Estrogens conjugated) .... Take one tablet by mouth daily 4)  Fluoxetine Hcl 20 Mg Caps (Fluoxetine hcl) .... Take one tablet by mouth once daily 5)  Vesicare 5 Mg Tabs (Solifenacin succinate) .... Take one tablet by mouth daily 6)  Aspir-low 81 Mg Tbec (Aspirin) .... Take one tablet by mouth daily 7)  Alprazolam 0.25 Mg Tabs (Alprazolam) .Marland Kitchen.. 1 tab by mouth three times a day as needed anxiety.  Other Orders: Radiology Referral (Radiology) TLB-Lipid Panel (80061-LIPID) Venipuncture (54098) TLB-CBC Platelet - w/Differential (85025-CBCD) Tdap => 36yrs IM (11914) Admin 1st Vaccine ([address])  Patient Instructions: 1)  Dr. Lorenda Cahill 2)  phone: [phone] 3)  We will call you with your labs next week. 4)  Pelase stop by to see Aram Beecham on  your way out to set up your referrals. Prescriptions: ALPRAZOLAM 0.25 MG TABS (ALPRAZOLAM) 1 tab by mouth three times a day as needed anxiety.  #60 x 0   Entered and Authorized by:   Ruthe Mannan MD   Signed by:   Ruthe Mannan MD on 07/30/2009   Method used:   Print then Give to Patient   RxID:   [id number]   Current Allergies (reviewed today): No known allergies   Prevention & Chronic Care Immunizations   Influenza vaccine: Not documented    Tetanus booster: 07/30/2009: given   Tetanus booster due: 07/31/2019    Pneumococcal vaccine: Not documented  Colorectal Screening   Hemoccult: Not documented    Hemoccult due: Not Indicated    Colonoscopy: historical-polyps  (06/17/2008)   Colonoscopy due: 06/18/2018  Other Screening   Pap smear: Not documented   Pap smear action/deferral: Not indicated S/P hysterectomy  (07/30/2009)   Pap smear due: Not Indicated    Mammogram: historical, normal  (06/02/2008)   Mammogram action/deferral: Ordered  (07/30/2009)   Mammogram due: 06/02/2009   Smoking status: current  (07/30/2009)  Lipids   Total Cholesterol: Not documented   Lipid panel action/deferral: Lipid Panel ordered   LDL: Not documented   LDL Direct: Not documented   HDL: Not documented   Triglycerides: Not documented  Hypertension   Last Blood Pressure: 130 / 90  (07/30/2009)   Serum creatinine: Not documented   BMP action: Ordered   Serum potassium Not documented  Self-Management Support :    Hypertension self-management support: Not documented   Nursing Instructions: Give tetanus booster today Schedule screening mammogram (see order)    Orders Added: 1)  Radiology Referral [Radiology] 2)  TLB-Lipid Panel [80061-LIPID] 3)  TLB-BMP (Basic Metabolic Panel-BMET) [80048-METABOL] 4)  Dermatology Referral [Derma] 5)  Venipuncture [14782] 6)  TLB-TSH (Thyroid Stimulating Hormone) [84443-TSH] 7)  TLB-CBC Platelet - w/Differential [85025-CBCD] 8)  Tdap => 37yrs IM [90715] 9)  Admin 1st Vaccine [90471] 10)  New Patient Level III [95621]    Immunizations Administered:  Tetanus Vaccine:    Vaccine Type: Tdap    Site: right deltoid    Mfr: [employer]oSmith[employer]    Dose: 0.5 ml    Route: IM    Given by: Linde Gillis CMA (AAMA)    Exp. Date: 03/27/2011    Lot #: [id number]    VIS given: 11/20/06 version given July 30, 2009.  TD Result Date:  07/30/2009 TD Result:  given TD Next Due:  10 yr Flex Sig Next Due:  Not Indicated Colonoscopy Result Date:  06/17/2008 Colonoscopy Result:  historical-polyps Hemoccult Next Due:  Not Indicated PAP Next Due:  Not  Indicated Mammogram Result Date:  06/02/2008 Mammogram Result:  historical, normal Mammogram Next Due:  1 yr

## 2010-02-02 LAB — PATHOLOGY REPORT

## 2010-02-03 ENCOUNTER — Telehealth: Payer: Self-pay | Admitting: Family Medicine

## 2010-02-03 NOTE — Progress Notes (Signed)
Summary:  ALPRAZOLAM   Phone Note Refill Request Message from:  [facility] [phone] on January 04, 2010 11:02 AM  Refills Requested: Medication #1:  ALPRAZOLAM 0.25 MG TABS 1 tab by mouth three times a day as needed anxiety.   Last Refilled: 12/06/2009 faxed request   Method Requested: Telephone to Pharmacy Initial call taken by: DeShannon Smith CMA Duncan Dull),  January 04, 2010 11:03 AM  Follow-up for Phone Call        Rx called to pharmacy Follow-up by: Linde Gillis CMA Duncan Dull),  January 04, 2010 11:21 AM    Prescriptions: ALPRAZOLAM 0.25 MG TABS (ALPRAZOLAM) 1 tab by mouth three times a day as needed anxiety.  #60 x 0   Entered and Authorized by:   Ruthe Mannan MD   Signed by:   Ruthe Mannan MD on 01/04/2010   Method used:   Telephoned to ...       [facility] [address] (retail)       [address]       [place], [place]  [address]       Ph: [phone]       Fax: [phone]   RxID:   [id number]

## 2010-02-03 NOTE — Consult Note (Signed)
Summary: [facility]  [facility]   Imported By: Lanelle Bal 12/18/2009 10:06:10  _____________________________________________________________________  External Attachment:    Type:   Image     Comment:   External Document

## 2010-02-03 NOTE — Assessment & Plan Note (Signed)
Summary: 1 M F/U DLO   Vital Signs:  Patient profile:   55 year old female Height:      70 inches Weight:      185.50 pounds BMI:     26.71 Temp:     98.5 degrees F oral Pulse rate:   72 / minute Pulse rhythm:   regular BP sitting:   130 / 86  (left arm) Cuff size:   regular  Vitals Entered By: Linde Gillis CMA Duncan Dull) (January 10, 2010 3:55 PM) CC: one month follow up hypertension   History of Present Illness: 55 yo here for follow up:  HTN- Has been on HCTZ/Lisinopril for years.  No CP, SOB, LE edema.  No CP or blurred vision but over past several months.  Had a dry cough so we d/c'd her lisinopril last month and added Amlodipine 5 mg daily to her HCTZ 25 mg daily.  Felt fine but over past 2 weeks, increased fatigue. Feels like she could sleep at any time.   CBC, BMET normal last month.  Mood has actually been quite good.  Seeing her psychiatirst and therapist regularly.    Current Medications (verified): 1)  Ibuprofen 800 Mg Tabs (Ibuprofen) .... Take One To Two Tablets By Mouth As Needed 2)  Premarin 0.9 Mg Tabs (Estrogens Conjugated) .... Take One Tablet By Mouth Daily 3)  Fluoxetine Hcl 20 Mg Caps (Fluoxetine Hcl) .... Take 3 Tabs By Mouth Daily 4)  Vesicare 5 Mg Tabs (Solifenacin Succinate) .... Take One Tablet By Mouth Daily 5)  Aspir-Low 81 Mg Tbec (Aspirin) .... Take One Tablet By Mouth Daily 6)  Alprazolam 0.25 Mg Tabs (Alprazolam) .Marland Kitchen.. 1 Tab By Mouth Three Times A Day As Needed Anxiety. 7)  Nuvigil 50 Mg Tabs (Armodafinil) .... Take One Tablet By Mouth Daily 8)  Omeprazole 20 Mg Cpdr (Omeprazole) .... Take One Tablet By Mouth Daily 9)  Amlodipine Besylate 5 Mg Tabs (Amlodipine Besylate) .Marland Kitchen.. 1 Tab By Mouth Daily. 10)  Hydrochlorothiazide 25 Mg  Tabs (Hydrochlorothiazide) .... Take 1 Tab By Mouth Every Morning  Allergies (verified): 1)  ! Ace Inhibitors  Past History:  Past Medical History: Last updated: 08/28/2009   OSA--AHI 14/hr 05/23/2000. ADJUSTMENT  DISORDER WITH MIXED FEATURES (ICD-309.28) TOBACCO ABUSE (ICD-305.1) FATIGUE (ICD-780.79) NEVUS (ICD-216.9) UNSPECIFIED DERMATITIS DUE TO SUN (ICD-692.70) ACUTE SINUSITIS, UNSPECIFIED (ICD-461.9) HYPERTENSION (ICD-401.9)  + PPD 1987  Past Surgical History: Last updated: 07/30/2009 Hysterectomy (12/03/2006) benign  Family History: Last updated: 08/28/09 Mom died of 18 of Melanoma Dad alive- HTN, HLD, heart disease  Social History: Last updated: 08/28/2009 Current Smoker -1 ppd.  started at age 7. Moved here from [place]. Daughter died of drug OD in 24-May-2006. 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   Risk Factors: Smoking Status: current (07/30/2009)  Review of Systems      See HPI General:  Denies malaise. Eyes:  Denies blurring. CV:  Denies chest pain or discomfort. Resp:  Denies shortness of breath, sputum productive, and wheezing. Psych:  Denies sense of great danger, suicidal thoughts/plans, thoughts of violence, unusual visions or sounds, and thoughts /plans of harming others.  Physical Exam  General:  Well-developed,well-nourished,in no acute distress; alert,appropriate and cooperative throughout examination VSS Mouth:  good dentition.   Lungs:  Normal respiratory effort, chest expands symmetrically. Lungs are clear to auscultation, no crackles or wheezes. Heart:  Normal rate and regular rhythm. S1 and S2 normal without gallop, murmur, click, rub or other extra sounds.  Extremities:  No clubbing, cyanosis, edema, or deformity noted with normal full range of motion of all joints.   Psych:  Oriented X3, memory intact for recent and remote, good eye contact, not anxious appearing, and not depressed appearing.     Impression & Recommendations:  Problem # 1:  FATIGUE (ICD-780.79) Assessment New Likely multifactorial and unrelated to amlodipine. Will check B12/folate, TSH, LH and FSH. Orders: Venipuncture (10272) TLB-B12 + Folate Pnl  (82746_82607-B12/FOL) TLB-BMP (Basic Metabolic Panel-BMET) (80048-METABOL) TLB-TSH (Thyroid Stimulating Hormone) (84443-TSH) Specimen Handling (53664) TLB-FSH (Follicle Stimulating Hormone) (83001-FSH) TLB-Luteinizing Hormone (LH) (83002-LH)  Problem # 2:  HYPERTENSION (ICD-401.9) Assessment: Unchanged stable.  Continue amlodipine and HCTZ. Her updated medication list for this problem includes:    Amlodipine Besylate 5 Mg Tabs (Amlodipine besylate) .Marland Kitchen... 1 tab by mouth daily.    Hydrochlorothiazide 25 Mg Tabs (Hydrochlorothiazide) .Marland Kitchen... Take 1 tab by mouth every morning  Complete Medication List: 1)  Ibuprofen 800 Mg Tabs (Ibuprofen) .... Take one to two tablets by mouth as needed 2)  Premarin 0.9 Mg Tabs (Estrogens conjugated) .... Take one tablet by mouth daily 3)  Fluoxetine Hcl 20 Mg Caps (Fluoxetine hcl) .... Take 3 tabs by mouth daily 4)  Vesicare 5 Mg Tabs (Solifenacin succinate) .... Take one tablet by mouth daily 5)  Aspir-low 81 Mg Tbec (Aspirin) .... Take one tablet by mouth daily 6)  Alprazolam 0.25 Mg Tabs (Alprazolam) .Marland Kitchen.. 1 tab by mouth three times a day as needed anxiety. 7)  Nuvigil 50 Mg Tabs (Armodafinil) .... Take one tablet by mouth daily 8)  Omeprazole 20 Mg Cpdr (Omeprazole) .... Take one tablet by mouth daily 9)  Amlodipine Besylate 5 Mg Tabs (Amlodipine besylate) .Marland Kitchen.. 1 tab by mouth daily. 10)  Hydrochlorothiazide 25 Mg Tabs (Hydrochlorothiazide) .... Take 1 tab by mouth every morning   Orders Added: 1)  Venipuncture [36415] 2)  TLB-B12 + Folate Pnl [82746_82607-B12/FOL] 3)  TLB-BMP (Basic Metabolic Panel-BMET) [80048-METABOL] 4)  TLB-TSH (Thyroid Stimulating Hormone) [84443-TSH] 5)  Specimen Handling [99000] 6)  TLB-FSH (Follicle Stimulating Hormone) [83001-FSH] 7)  TLB-Luteinizing Hormone (LH) [83002-LH] 8)  Est. Patient Level IV [40347]    Current Allergies (reviewed today): ! ACE INHIBITORS

## 2010-02-07 ENCOUNTER — Other Ambulatory Visit: Payer: Self-pay | Admitting: Family Medicine

## 2010-02-07 ENCOUNTER — Ambulatory Visit (INDEPENDENT_AMBULATORY_CARE_PROVIDER_SITE_OTHER)
Admission: RE | Admit: 2010-02-07 | Discharge: 2010-02-07 | Disposition: A | Discharge status: Home / Self Care | Payer: 59 | Source: Ambulatory Visit | Attending: Family Medicine | Admitting: Family Medicine

## 2010-02-07 ENCOUNTER — Ambulatory Visit (INDEPENDENT_AMBULATORY_CARE_PROVIDER_SITE_OTHER): Payer: 59 | Admitting: Family Medicine

## 2010-02-07 ENCOUNTER — Encounter: Payer: Self-pay | Admitting: Family Medicine

## 2010-02-07 ENCOUNTER — Telehealth: Payer: Self-pay | Admitting: Family Medicine

## 2010-02-07 DIAGNOSIS — R0602 Shortness of breath: Secondary | ICD-10-CM

## 2010-02-08 ENCOUNTER — Other Ambulatory Visit: Payer: Self-pay | Admitting: Family Medicine

## 2010-02-08 DIAGNOSIS — R0602 Shortness of breath: Secondary | ICD-10-CM

## 2010-02-09 ENCOUNTER — Ambulatory Visit (INDEPENDENT_AMBULATORY_CARE_PROVIDER_SITE_OTHER): Payer: 59 | Admitting: Family Medicine

## 2010-02-09 ENCOUNTER — Encounter: Payer: Self-pay | Admitting: Family Medicine

## 2010-02-09 DIAGNOSIS — M76899 Other specified enthesopathies of unspecified lower limb, excluding foot: Secondary | ICD-10-CM | POA: Insufficient documentation

## 2010-02-09 DIAGNOSIS — M542 Cervicalgia: Secondary | ICD-10-CM

## 2010-02-09 DIAGNOSIS — M19049 Primary osteoarthritis, unspecified hand: Secondary | ICD-10-CM | POA: Insufficient documentation

## 2010-02-09 DIAGNOSIS — M719 Bursopathy, unspecified: Secondary | ICD-10-CM | POA: Insufficient documentation

## 2010-02-09 DIAGNOSIS — M67919 Unspecified disorder of synovium and tendon, unspecified shoulder: Secondary | ICD-10-CM | POA: Insufficient documentation

## 2010-02-09 NOTE — Progress Notes (Signed)
Summary: several concerns  Phone Note Call from Patient Call back at Home Phone [phone]   Caller: Patient Summary of Call: Pt would like a chest CT because she is a smoker and has cough and shortness of breath.  Also wants a CRT blood test to check for inflammation and a lab test to check for inflammation caused by an antibody from a dairy allergy.   Initial call taken by: Lowella Petties CMA, AAMA,  February 03, 2010 11:05 AM  Follow-up for Phone Call        cannot order a chest CT without being evaluated. Ruthe Mannan MD  February 03, 2010 11:06 AM  [facility] for pt to call to schedule appt. Follow-up by: Lowella Petties CMA, AAMA,  February 03, 2010 11:10 AM

## 2010-02-11 ENCOUNTER — Ambulatory Visit (INDEPENDENT_AMBULATORY_CARE_PROVIDER_SITE_OTHER)
Admission: RE | Admit: 2010-02-11 | Discharge: 2010-02-11 | Disposition: A | Discharge status: Home / Self Care | Payer: 59 | Source: Ambulatory Visit | Attending: Family Medicine | Admitting: Family Medicine

## 2010-02-11 DIAGNOSIS — R0602 Shortness of breath: Secondary | ICD-10-CM

## 2010-02-14 ENCOUNTER — Telehealth: Payer: Self-pay | Admitting: Family Medicine

## 2010-02-14 ENCOUNTER — Encounter: Payer: Self-pay | Admitting: Family Medicine

## 2010-02-14 ENCOUNTER — Other Ambulatory Visit: Payer: Self-pay | Admitting: Family Medicine

## 2010-02-14 DIAGNOSIS — R911 Solitary pulmonary nodule: Secondary | ICD-10-CM

## 2010-02-14 DIAGNOSIS — J984 Other disorders of lung: Secondary | ICD-10-CM | POA: Insufficient documentation

## 2010-02-15 ENCOUNTER — Encounter: Payer: Self-pay | Admitting: Family Medicine

## 2010-02-17 NOTE — Progress Notes (Signed)
Summary: alprazolam   Phone Note Refill Request Message from:  Fax from Pharmacy on February 07, 2010 1:31 PM  Refills Requested: Medication #1:  ALPRAZOLAM 0.25 MG TABS 1 tab by mouth three times a day as needed anxiety.   Last Refilled: 01/04/2010 [facility] from [facility] [address] [phone].   Initial call taken by: Melody Comas,  February 07, 2010 1:31 PM  Follow-up for Phone Call        Rx called to pharmacy Follow-up by: Linde Gillis CMA Duncan Dull),  February 07, 2010 1:45 PM    Prescriptions: ALPRAZOLAM 0.25 MG TABS (ALPRAZOLAM) 1 tab by mouth three times a day as needed anxiety.  #60 x 0   Entered and Authorized by:   Ruthe Mannan MD   Signed by:   Ruthe Mannan MD on 02/07/2010   Method used:   Telephoned to ...       [facility] [address]       [address], [address]       [place], [place]  [address]       Ph: [phone]       Fax: [phone]   RxID:   [id number]

## 2010-02-17 NOTE — Miscellaneous (Signed)
Summary: Orders Update  Clinical Lists Changes  Orders: Added new Test order of T-2 View CXR (71020TC) - Signed 

## 2010-02-17 NOTE — Procedures (Signed)
Summary: Colonoscopy   Colonoscopy   Imported By: Kassie Mends 02/09/2010 11:09:27  _____________________________________________________________________  External Attachment:    Type:   Image     Comment:   External Document  Appended Document: Colonoscopy  polyps, awaiting path.

## 2010-02-17 NOTE — Assessment & Plan Note (Signed)
Summary: JOINT PAIN,HIP PAIN/CLE  [facility]   Vital Signs:  Patient profile:   55 year old female Height:      70 inches Weight:      185.75 pounds BMI:     26.75 Temp:     97.4 degrees F oral Pulse rate:   76 / minute Pulse rhythm:   regular BP sitting:   130 / 80  (left arm) Cuff size:   regular  Vitals Entered By: Benny Lennert CMA Duncan Dull) (February 09, 2010 4:13 PM)  History of Present Illness: Chief complaint multiple joint pains  55 year old female:  this patient presents with multiple  joint complaints including  left-sided shoulder pain, bilateral  hip pain, trochanteric bursitis, and  ongoing chronic neck pain.  hip and trochanteric bursitis. Moved here a year ago, has had some trochanteric buristis in the past. Some OA historically.ttoday, she denies any true groin pain, but has lateral hip pain bilaterally. Denies pain in the buttocks.  Was on some meloxicam and ibuprofen at the same time. then told to stop this. she had a GI evaluation approximately one week ago, and  was told that she had some irritation in her intestinal and stomach lining.  Had a colonoscopy on Monday.   For about a year, has not done much, slept.  Has a psychiatrist and a therapist.   Body is really weak. Feels like she is an older woman.  Wakes up at night with hip pain.   Also has pain with abduction of the left shoulder. No history of traumatic dislocation,  pain in the lateral shoulder. This does not wake her up at night. She has full range of motion with her shoulder. No numbness or tingling.  also having some neck pain. long-standing problem, mild restriction in range of motion and terminal motion the causes and makes her feel stiff.  L rotator cuff tendonitis B troch bursitis neck pain  Allergies: 1)  ! Ace Inhibitors  Past History:  Past medical, surgical, family and social histories (including risk factors) reviewed, and no changes noted (except as noted below).  Past Medical  History: Reviewed history from 08/18/2009 and no changes required.   OSA--AHI 14/hr 2000/05/27. 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 Hysterectomy (12/03/2006) benign  Family History: Reviewed history from 08/18/2009 and no changes required. Mom died of 61 of Melanoma Dad alive- HTN, HLD, heart disease  Social History: Reviewed history from 08/18/2009 and no changes required. Current Smoker -1 ppd.  started at age 50. Moved here from [place]. Daughter died of drug OD in 2006-05-28. 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   Review of Systems       REVIEW OF SYSTEMS  GEN: No systemic complaints, no fevers, chills, sweats, or other acute illnesses MSK: Detailed in the HPI GI: tolerating PO intake without difficulty Neuro: No numbness, parasthesias, or tingling associated. Otherwise the pertinent positives of the ROS are noted above.    Physical Exam  General:  Well-developed,well-nourished,in no acute distress; alert,appropriate and cooperative throughout examination Head:  Normocephalic and atraumatic without obvious abnormalities. No apparent alopecia or balding. Ears:  no external deformities.   Nose:  no external deformity.   Msk:  Shoulder: L Inspection: No muscle wasting or winging Ecchymosis/edema: neg  AC joint, scapula, clavicle: NT Cervical spine: NT, full ROM Spurling's: neg Abduction: full, 5/5 Flexion: full, 5/5 IR, full, lift-off: 5/5 ER at neutral: full, 5/5 AC crossover: neg Neer: pos Hawkins: pos Drop Test: neg Empty Can: pos Supraspinatus insertion: mild-mod T Bicipital groove: NT Speed's: neg Yergason's: neg Sulcus sign: neg Scapular  dyskinesis: none C5-T1 intact  Neuro: Sensation intact Grip 5/5  Additional Exam:    HIP EXAM: SIDE: B ROM: Abduction, Flexion, Internal and External range of motion: WNL Pain with terminal IROM and EROM: no GTB: markedly TTP SLR: NEG Knees: No effusion FABER: NT REVERSE FABER: NT, neg Piriformis: NT at direct palpation Str: flexion: 5/5 abduction: 4-/5, easily breaks with minimal resistance adduction: 4+/5 Strength testing non-tender   neck with mild pain with terminal motion, 10% loss of motion, c5-t1 intact.   Impression & Recommendations:  Problem # 1:  TROCHANTERIC BURSITIS, BILATERAL (ICD-726.5) Assessment New >25 minutes spent in face to face time with patient, >50% spent in counselling or coordination of care: multiple problems which to  clearly the patient has acute trochanteric bursitis bilaterally as well as  classic  left-sided impingement syndrome. Also with classic CMC arthritis bilaterally.  The patient has been completely inactive physically for greater than one year, and I think that her biomechanical dysfunction in her shoulder blade and scapular mechanics as well as her pelvic  weakness  dramatically contributes to all of her symptoms.  At this point I think that exercise and physical therapy is going to be by far the most important thing to returning her to formal  and normal function.Maryclare Labrador initiate this, and I gave her a upper extremity and  hip protocols by [facility] to initiate her rehabilitation, and then we'll progress with physical therapy. Followup in 6-8 weeks.  Orders: Physical Therapy Referral (PT)  Problem # 2:  ROTATOR CUFF SYNDROME, LEFT (ICD-726.10) Assessment: New  Orders: Physical Therapy Referral (PT)  Problem # 3:  NECK PAIN (ICD-723.1) Assessment: New  Her updated medication list for this problem includes:    Ibuprofen 800 Mg Tabs (Ibuprofen) .Marland Kitchen... Take one to two tablets by mouth as needed    Aspir-low 81 Mg Tbec (Aspirin) .Marland Kitchen... Take  one tablet by mouth daily  Orders: Physical Therapy Referral (PT)  Problem # 4:  ARTHRITIS, CARPOMETACARPAL JOINT, BILATERAL (ZOX-096.04) Assessment: New also noted B CMC OA, c/o hand pain  Complete Medication List: 1)  Ibuprofen 800 Mg Tabs (Ibuprofen) .... Take one to two tablets by mouth as needed 2)  Premarin 0.9 Mg Tabs (Estrogens conjugated) .... Take one tablet by mouth daily 3)  Fluoxetine Hcl 20 Mg Caps (Fluoxetine hcl) .... Take 3 tabs by mouth daily 4)  Vesicare 5 Mg Tabs (Solifenacin succinate) .... Take one tablet by mouth daily 5)  Aspir-low 81 Mg Tbec (Aspirin) .... Take one tablet by mouth daily 6)  Alprazolam 0.25 Mg Tabs (Alprazolam) .Marland Kitchen.. 1 tab by mouth three times a day as needed anxiety. 7)  Nuvigil 50 Mg Tabs (Armodafinil) .... Take one tablet by mouth daily 8)  Omeprazole 20 Mg Cpdr (Omeprazole) .... Take one tablet by mouth daily 9)  Amlodipine Besylate 5 Mg Tabs (Amlodipine besylate) .Marland Kitchen.. 1 tab by mouth daily. 10)  Hydrochlorothiazide 25 Mg Tabs (Hydrochlorothiazide) .... Take 1 tab by mouth every morning  Patient Instructions: 1)  Referral Appointment Information 2)  Day/Date: 3)  Time: 4)  Place/MD: 5)  Address: 6)  Phone/Fax: 7)  Patient given appointment information. Information/Orders faxed/mailed.  Orders Added: 1)  Physical Therapy Referral [PT] 2)  Est. Patient Level IV [04540]    Current Allergies (reviewed today): ! ACE INHIBITORS

## 2010-02-17 NOTE — Assessment & Plan Note (Signed)
Summary: OV/CLE   [facility]   Vital Signs:  Patient profile:   55 year old female Height:      70 inches Weight:      186.25 pounds BMI:     26.82 Temp:     98.8 degrees F oral Pulse rate:   76 / minute Pulse rhythm:   regular BP sitting:   122 / 88  (left arm) Cuff size:   regular  Vitals Entered By: Linde Gillis CMA Duncan Dull) (February 07, 2010 3:37 PM) CC: follow up   History of Present Illness: 55 yo here for concerns about lung cancer.  She is a long term smoker and has had increased cough and rib pain when she coughs. Sometimes more short of breath.  No CP.  No wheezing.  Has yearly CXR since she was exposed to TB but xrays have been unremarkable.  No night sweats or weight loss.    Current Medications (verified): 1)  Ibuprofen 800 Mg Tabs (Ibuprofen) .... Take One To Two Tablets By Mouth As Needed 2)  Premarin 0.9 Mg Tabs (Estrogens Conjugated) .... Take One Tablet By Mouth Daily 3)  Fluoxetine Hcl 20 Mg Caps (Fluoxetine Hcl) .... Take 3 Tabs By Mouth Daily 4)  Vesicare 5 Mg Tabs (Solifenacin Succinate) .... Take One Tablet By Mouth Daily 5)  Aspir-Low 81 Mg Tbec (Aspirin) .... Take One Tablet By Mouth Daily 6)  Alprazolam 0.25 Mg Tabs (Alprazolam) .Marland Kitchen.. 1 Tab By Mouth Three Times A Day As Needed Anxiety. 7)  Nuvigil 50 Mg Tabs (Armodafinil) .... Take One Tablet By Mouth Daily 8)  Omeprazole 20 Mg Cpdr (Omeprazole) .... Take One Tablet By Mouth Daily 9)  Amlodipine Besylate 5 Mg Tabs (Amlodipine Besylate) .Marland Kitchen.. 1 Tab By Mouth Daily. 10)  Hydrochlorothiazide 25 Mg  Tabs (Hydrochlorothiazide) .... Take 1 Tab By Mouth Every Morning  Allergies: 1)  ! Ace Inhibitors  Past History:  Past Medical History: Last updated: 08-23-2009   OSA--AHI 14/hr May 18, 2000. 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  started at age 70. Moved here from [place]. Daughter died of drug OD in 2006/05/19. 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   Risk Factors: Smoking Status: current (07/30/2009)  Review of Systems      See HPI General:  Denies fever and sweats. CV:  Denies chest pain or discomfort. Resp:  Complains of cough, shortness of breath, and sputum productive; denies wheezing.  Physical Exam  General:  Well-developed,well-nourished,in no acute distress; alert,appropriate and cooperative throughout examination VSS Lungs:  Normal respiratory effort, chest expands symmetrically. Lungs are clear to auscultation, no crackles or wheezes. Heart:  Normal rate and regular rhythm. S1 and S2 normal without gallop, murmur, click, rub or other extra sounds. Extremities:  No clubbing, cyanosis, edema, or deformity noted with normal full range of motion of all joints.   Psych:  Oriented X3, memory intact for recent and remote, good eye contact, not anxious appearing, and not depressed appearing.     Impression & Recommendations:  Problem # 1:  SHORTNESS OF BREATH (ICD-786.05) Assessment Deteriorated  long term smoker, h/o TB exposure and  OSA. will order chest CT without contrast.  Orders: Radiology Referral (Radiology)  Complete Medication List: 1)  Ibuprofen 800 Mg Tabs (Ibuprofen) .... Take one to two tablets by mouth as needed 2)  Premarin 0.9 Mg Tabs (Estrogens conjugated) .... Take one tablet by mouth daily 3)  Fluoxetine Hcl 20 Mg Caps (Fluoxetine hcl) .... Take 3 tabs by mouth daily 4)  Vesicare 5 Mg Tabs (Solifenacin succinate) .... Take one tablet by mouth daily 5)  Aspir-low 81 Mg Tbec  (Aspirin) .... Take one tablet by mouth daily 6)  Alprazolam 0.25 Mg Tabs (Alprazolam) .Marland Kitchen.. 1 tab by mouth three times a day as needed anxiety. 7)  Nuvigil 50 Mg Tabs (Armodafinil) .... Take one tablet by mouth daily 8)  Omeprazole 20 Mg Cpdr (Omeprazole) .... Take one tablet by mouth daily 9)  Amlodipine Besylate 5 Mg Tabs (Amlodipine besylate) .Marland Kitchen.. 1 tab by mouth daily. 10)  Hydrochlorothiazide 25 Mg Tabs (Hydrochlorothiazide) .... Take 1 tab by mouth every morning  Patient Instructions: 1)  please stop by to see Shirlee Limerick on your way out.   Orders Added: 1)  Radiology Referral [Radiology] 2)  Est. Patient Level IV [04540]    Current Allergies (reviewed today): ! ACE INHIBITORS

## 2010-02-18 ENCOUNTER — Telehealth: Payer: Self-pay | Admitting: Pulmonary Disease

## 2010-02-23 NOTE — Miscellaneous (Signed)
Summary: Orders Update   Clinical Lists Changes  Problems: Added new problem of OTHER DISEASES OF LUNG NOT ELSEWHERE CLASSIFIED (ICD-518.89) Orders: Added new Referral order of Radiology Referral (Radiology) - Signed 

## 2010-02-23 NOTE — Progress Notes (Signed)
Summary: pt returned your call  Phone Note Call from Patient Call back at Home Phone [phone]   Caller: Patient Call For: Ruthe Mannan MD Summary of Call: Advised pt of cxr results and that you would call her back. I suggested she keep her phone handy so that she can receive the call. Initial call taken by: Lowella Petties CMA, AAMA,  February 14, 2010 9:48 AM  Follow-up for Phone Call        called pt back.  discussed CT scan results.  Will schedule repeat scan in 6 months. Ruthe Mannan MD  February 14, 2010 9:56 AM

## 2010-03-01 NOTE — Progress Notes (Signed)
Summary: wants opion of lung ct  Phone Note Call from Patient Call back at Home Phone [phone]   Caller: Patient Call For: [facility] Summary of Call: Patient phoned stated that she had a CT of lung about one week ago Dr Dayton Martes called patient with the results and told her that it shows a left lung nodule and she wanted to know if Dr Shelle Iron would be willing to look at this and give her his opinion. she can be reached at [phone] Initial call taken by: Vedia Coffer,  February 18, 2010 11:02 AM  Follow-up for Phone Call        spoke with pt and she stated that she had a ct scan done today and wanted to know if [facility] would review the results and give his opinion of the results.  please advise--ct done at cardiology today [address] [facility]  February 18, 2010 3:18 PM   Additional Follow-up for Phone Call Additional follow up Details #1::        let her know that Dr. Elmer Sow plan is the correct one. needs surveillance Additional Follow-up by: Barbaraann Share MD,  February 18, 2010 6:01 PM    Additional Follow-up for Phone Call Additional follow up Details #2::    Pt advised of recs. Carron Curie CMA  February 21, 2010 10:39 AM

## 2010-03-15 ENCOUNTER — Telehealth: Payer: Self-pay | Admitting: Family Medicine

## 2010-03-22 NOTE — Progress Notes (Signed)
Summary: refill request for alprazolam  Phone Note Refill Request Message from:  Fax from Pharmacy  Refills Requested: Medication #1:  ALPRAZOLAM 0.25 MG TABS 1 tab by mouth three times a day as needed anxiety.   Last Refilled: 02/07/2010 [facility] [address], [phone].  Initial call taken by: Lowella Petties CMA, AAMA,  March 15, 2010 9:12 AM  Follow-up for Phone Call        Rx called to pharmacy Follow-up by: Linde Gillis CMA Duncan Dull),  March 15, 2010 9:43 AM    Prescriptions: ALPRAZOLAM 0.25 MG TABS (ALPRAZOLAM) 1 tab by mouth three times a day as needed anxiety.  #60 x 0   Entered and Authorized by:   Ruthe Mannan MD   Signed by:   Ruthe Mannan MD on 03/15/2010   Method used:   Telephoned to ...       [facility] (retail)       [address], [address]       [place], [place]  [address]       Ph: [phone]       Fax: [phone]   RxID:   [id number]

## 2010-03-22 NOTE — Letter (Signed)
Summary: [facility]   Imported By: Kassie Mends 03/16/2010 08:32:17  _____________________________________________________________________  External Attachment:    Type:   Image     Comment:   External Document

## 2010-03-29 ENCOUNTER — Encounter: Payer: Self-pay | Admitting: Family Medicine

## 2010-04-06 ENCOUNTER — Other Ambulatory Visit: Payer: Self-pay | Admitting: Family Medicine

## 2010-04-06 ENCOUNTER — Ambulatory Visit: Payer: 59 | Admitting: Family Medicine

## 2010-04-12 ENCOUNTER — Other Ambulatory Visit: Payer: Self-pay | Admitting: *Deleted

## 2010-04-12 MED ORDER — ALPRAZOLAM 0.25 MG PO TABS: 0.2500 mg | ORAL_TABLET | Freq: Three times a day (TID) | ORAL | Status: DC | PRN

## 2010-04-12 NOTE — Telephone Encounter (Signed)
Rx faxed to [facility] [address] 

## 2010-05-09 ENCOUNTER — Ambulatory Visit: Payer: 59 | Admitting: Family Medicine

## 2010-05-11 ENCOUNTER — Ambulatory Visit: Payer: 59 | Admitting: Family Medicine

## 2010-05-18 ENCOUNTER — Ambulatory Visit: Payer: 59 | Admitting: Pulmonary Disease

## 2010-05-19 ENCOUNTER — Ambulatory Visit: Payer: 59 | Admitting: Pulmonary Disease

## 2010-06-07 ENCOUNTER — Other Ambulatory Visit: Payer: Self-pay | Admitting: *Deleted

## 2010-06-07 MED ORDER — ALPRAZOLAM 0.25 MG PO TABS: 0.2500 mg | ORAL_TABLET | Freq: Three times a day (TID) | ORAL | Status: DC | PRN

## 2010-06-07 NOTE — Telephone Encounter (Signed)
[facility] to [facility]. 

## 2010-07-05 ENCOUNTER — Other Ambulatory Visit: Payer: Self-pay | Admitting: Family Medicine

## 2010-07-05 NOTE — Telephone Encounter (Signed)
[facility] to [facility]. 

## 2010-07-12 ENCOUNTER — Ambulatory Visit (INDEPENDENT_AMBULATORY_CARE_PROVIDER_SITE_OTHER)
Admission: RE | Admit: 2010-07-12 | Discharge: 2010-07-12 | Disposition: A | Discharge status: Home / Self Care | Payer: 59 | Source: Ambulatory Visit | Attending: Family Medicine | Admitting: Family Medicine

## 2010-07-12 ENCOUNTER — Telehealth: Payer: Self-pay | Admitting: *Deleted

## 2010-07-12 ENCOUNTER — Other Ambulatory Visit: Payer: Self-pay | Admitting: Family Medicine

## 2010-07-12 DIAGNOSIS — R911 Solitary pulmonary nodule: Secondary | ICD-10-CM

## 2010-07-12 DIAGNOSIS — N2889 Other specified disorders of kidney and ureter: Secondary | ICD-10-CM

## 2010-07-12 DIAGNOSIS — J984 Other disorders of lung: Secondary | ICD-10-CM

## 2010-07-12 NOTE — Telephone Encounter (Addendum)
CT chest; incompletely imaged left renal lesion favor hemorrhagic cyst solid neoplasm can not be excluded, further evaluation with ultrasound or pre and post contrast abd MRI (preferred) verses CT should be considered. Report being faxed.

## 2010-07-12 NOTE — Telephone Encounter (Signed)
Noted.  Left voicemail for pt to return my call.

## 2010-07-14 ENCOUNTER — Other Ambulatory Visit: Payer: Self-pay | Admitting: Family Medicine

## 2010-07-14 ENCOUNTER — Ambulatory Visit
Admission: RE | Admit: 2010-07-14 | Discharge: 2010-07-14 | Disposition: A | Discharge status: Home / Self Care | Payer: 59 | Source: Ambulatory Visit | Attending: Family Medicine | Admitting: Family Medicine

## 2010-07-14 DIAGNOSIS — N2889 Other specified disorders of kidney and ureter: Secondary | ICD-10-CM

## 2010-07-22 ENCOUNTER — Other Ambulatory Visit: Payer: Self-pay | Admitting: *Deleted

## 2010-07-22 ENCOUNTER — Other Ambulatory Visit: Payer: Self-pay | Admitting: Family Medicine

## 2010-07-22 ENCOUNTER — Ambulatory Visit
Admission: RE | Admit: 2010-07-22 | Discharge: 2010-07-22 | Disposition: A | Discharge status: Home / Self Care | Payer: 59 | Source: Ambulatory Visit | Attending: Family Medicine | Admitting: Family Medicine

## 2010-07-22 DIAGNOSIS — N281 Cyst of kidney, acquired: Secondary | ICD-10-CM

## 2010-07-22 DIAGNOSIS — N2889 Other specified disorders of kidney and ureter: Secondary | ICD-10-CM

## 2010-07-22 MED ORDER — ALPRAZOLAM 0.25 MG PO TABS: 0.2500 mg | ORAL_TABLET | Freq: Three times a day (TID) | ORAL | Status: AC | PRN

## 2010-07-22 MED ORDER — GADOBENATE DIMEGLUMINE 529 MG/ML IV SOLN
15.0000 mL | Freq: Once | INTRAVENOUS | Status: AC | PRN
  Administered 2010-07-22: 15 mL via INTRAVENOUS

## 2010-07-22 MED ORDER — HYDROCHLOROTHIAZIDE 25 MG PO TABS: 25.0000 mg | ORAL_TABLET | Freq: Every day | ORAL | Status: DC

## 2010-07-22 MED ORDER — IBUPROFEN 800 MG PO TABS: 800.0000 mg | ORAL_TABLET | Freq: Two times a day (BID) | ORAL | Status: DC

## 2010-07-22 MED ORDER — OMEPRAZOLE 20 MG PO CPDR: 20.0000 mg | DELAYED_RELEASE_CAPSULE | Freq: Two times a day (BID) | ORAL | Status: DC

## 2010-07-22 MED ORDER — FLUOXETINE HCL 20 MG PO CAPS: 60.0000 mg | ORAL_CAPSULE | Freq: Every day | ORAL | Status: DC

## 2010-07-22 MED ORDER — SOLIFENACIN SUCCINATE 5 MG PO TABS: 10.0000 mg | ORAL_TABLET | Freq: Every day | ORAL | Status: DC

## 2010-07-22 MED ORDER — AMLODIPINE BESYLATE 5 MG PO TABS: 5.0000 mg | ORAL_TABLET | Freq: Every day | ORAL | Status: DC

## 2010-07-26 ENCOUNTER — Other Ambulatory Visit: Payer: Self-pay | Admitting: *Deleted

## 2010-07-26 MED ORDER — SOLIFENACIN SUCCINATE 5 MG PO TABS: 10.0000 mg | ORAL_TABLET | Freq: Every day | ORAL | Status: DC

## 2010-07-26 MED ORDER — ESTROGENS CONJUGATED 0.9 MG PO TABS: 0.9000 mg | ORAL_TABLET | Freq: Every day | ORAL | Status: DC

## 2010-07-27 ENCOUNTER — Telehealth: Payer: Self-pay | Admitting: *Deleted

## 2010-07-27 NOTE — Telephone Encounter (Signed)
No do not fill.  I thought she needed a 90 supply for future. I did not realize she was using two pharmacies.  That is breaking the controlled substances contract. Please let pt know she can only fill controlled substances at one pharmacy and we cannot give her a new rx until August 3rd. From now on, she can only receive 90 tablets (one month supply) at a time.

## 2010-07-27 NOTE — Telephone Encounter (Signed)
Brittney from [facility] called to make sure it was okay to go ahead and fill patient's rx for xanax. She just had this filled for # 90 on July 3rd and had that one filled at [facility] and now is trying to fill the scripts for # 270 at [facility]. Please advise.

## 2010-07-28 ENCOUNTER — Other Ambulatory Visit: Payer: Self-pay | Admitting: Family Medicine

## 2010-07-28 DIAGNOSIS — I10 Essential (primary) hypertension: Secondary | ICD-10-CM

## 2010-07-28 DIAGNOSIS — Z136 Encounter for screening for cardiovascular disorders: Secondary | ICD-10-CM

## 2010-07-28 NOTE — Telephone Encounter (Signed)
Ok to fill it now since her insurance will run out. From now on, has to use one pharmacy and I will only give a 30 day supply from this point forward.

## 2010-07-28 NOTE — Telephone Encounter (Signed)
Patient advised as instructed via telephone.  She stated that her insurance ends on 08/02/2010 and that is why she is requesting all of her medication for a 90 day supply.  She is not out of Xanax she just wants refills before her insurance ends.  She stated that she dropped the Rx off at [facility] only because she was on this side of town and had another appt to go to in the area.  I explained to her that this medication is a controlled substance and she can only get it filled at one pharmacy.  Please advise.

## 2010-07-28 NOTE — Telephone Encounter (Signed)
Patient advised as instructed via telephone.  Advised that Dr. Dayton Martes will only be prescribing a 30 day supply from now on.  Patient stated that she will only use [facility] to have her controlled medications refilled.

## 2010-07-29 ENCOUNTER — Other Ambulatory Visit (INDEPENDENT_AMBULATORY_CARE_PROVIDER_SITE_OTHER): Payer: 59 | Admitting: Family Medicine

## 2010-07-29 DIAGNOSIS — I10 Essential (primary) hypertension: Secondary | ICD-10-CM

## 2010-07-29 DIAGNOSIS — Z136 Encounter for screening for cardiovascular disorders: Secondary | ICD-10-CM

## 2010-07-29 LAB — LIPID PANEL
Cholesterol: 230 mg/dL — ABNORMAL HIGH (ref 0–200)
HDL: 107.6 mg/dL (ref >39.00)
Triglycerides: 99 mg/dL (ref 0.0–149.0)
VLDL: 19.8 mg/dL (ref 0.0–40.0)

## 2010-07-29 LAB — BASIC METABOLIC PANEL
BUN: 14 mg/dL (ref 6–23)
Calcium: 9.2 mg/dL (ref 8.4–10.5)
Creatinine, Ser: 0.7 mg/dL (ref 0.4–1.2)
GFR: 89.25 mL/min (ref >60.00)
Glucose, Bld: 98 mg/dL (ref 70–99)
Potassium: 3.1 mEq/L — ABNORMAL LOW (ref 3.5–5.1)

## 2010-07-29 LAB — LDL CHOLESTEROL, DIRECT: Direct LDL: 99 mg/dL

## 2010-08-01 ENCOUNTER — Other Ambulatory Visit: Payer: 59

## 2010-08-02 ENCOUNTER — Ambulatory Visit: Payer: Self-pay | Admitting: Internal Medicine

## 2010-08-02 ENCOUNTER — Ambulatory Visit: Payer: Self-pay | Admitting: Family Medicine

## 2010-08-05 ENCOUNTER — Encounter: Payer: Self-pay | Admitting: Family Medicine

## 2010-08-08 ENCOUNTER — Encounter: Payer: Self-pay | Admitting: *Deleted

## 2010-11-11 ENCOUNTER — Other Ambulatory Visit: Payer: Self-pay | Admitting: Internal Medicine

## 2010-11-11 ENCOUNTER — Other Ambulatory Visit: Payer: Self-pay | Admitting: *Deleted

## 2010-11-11 MED ORDER — FLUOXETINE HCL 20 MG PO CAPS: 60.0000 mg | ORAL_CAPSULE | Freq: Every day | ORAL | Status: DC

## 2010-11-11 NOTE — Telephone Encounter (Signed)
Last refill 07/22/2010 

## 2010-11-11 NOTE — Telephone Encounter (Signed)
Rx sent to [facility] electronically.

## 2010-12-22 ENCOUNTER — Other Ambulatory Visit: Payer: Self-pay | Admitting: Internal Medicine

## 2010-12-22 MED ORDER — ESTROGENS CONJUGATED 0.9 MG PO TABS: 0.9000 mg | ORAL_TABLET | Freq: Every day | ORAL | Status: DC

## 2010-12-22 NOTE — Telephone Encounter (Signed)
Request for Premarin.  Is she taking it daily or just for 21 days and off 7 days.  Please advise

## 2010-12-29 ENCOUNTER — Other Ambulatory Visit: Payer: Self-pay | Admitting: *Deleted

## 2010-12-30 MED ORDER — ALPRAZOLAM 0.25 MG PO TABS: 0.2500 mg | ORAL_TABLET | Freq: Three times a day (TID) | ORAL | Status: DC | PRN

## 2010-12-30 NOTE — Telephone Encounter (Signed)
Rx called to [facility]. 

## 2011-01-31 ENCOUNTER — Other Ambulatory Visit: Payer: Self-pay | Admitting: *Deleted

## 2011-01-31 MED ORDER — ALPRAZOLAM 0.25 MG PO TABS: 0.2500 mg | ORAL_TABLET | Freq: Three times a day (TID) | ORAL | Status: DC | PRN

## 2011-01-31 NOTE — Telephone Encounter (Signed)
Received faxed refill request from pharmacy. Is it okay to refill medicaiton?

## 2011-02-01 NOTE — Telephone Encounter (Signed)
Rx called to pharmacy as instructed. 

## 2011-02-23 ENCOUNTER — Other Ambulatory Visit: Payer: Self-pay

## 2011-02-23 MED ORDER — FLUOXETINE HCL 20 MG PO CAPS: 60.0000 mg | ORAL_CAPSULE | Freq: Every day | ORAL | Status: DC

## 2011-02-23 NOTE — Telephone Encounter (Signed)
Spoke with pt and scheduled f/u 03/03/11 at 2:45pm.

## 2011-02-23 NOTE — Telephone Encounter (Signed)
Ok to refill but pt needs appt with PCP.

## 2011-02-23 NOTE — Telephone Encounter (Signed)
[facility] faxed request Fluoxetine 20 mg. Last filled 01/21/11. Pt last seen by Dr Dayton Martes on 02/07/10 (looked in [facility]).Please advise.

## 2011-03-03 ENCOUNTER — Ambulatory Visit: Payer: 59 | Admitting: Family Medicine

## 2011-03-06 ENCOUNTER — Ambulatory Visit (INDEPENDENT_AMBULATORY_CARE_PROVIDER_SITE_OTHER): Payer: PRIVATE HEALTH INSURANCE | Admitting: Family Medicine

## 2011-03-06 ENCOUNTER — Encounter: Payer: Self-pay | Admitting: Family Medicine

## 2011-03-06 VITALS — BP 170/100 | HR 72 | Temp 98.0°F | Wt 188.0 lb

## 2011-03-06 DIAGNOSIS — I10 Essential (primary) hypertension: Secondary | ICD-10-CM

## 2011-03-06 DIAGNOSIS — F4323 Adjustment disorder with mixed anxiety and depressed mood: Secondary | ICD-10-CM

## 2011-03-06 MED ORDER — ARMODAFINIL 50 MG PO TABS: 100.0000 mg | ORAL_TABLET | Freq: Every day | ORAL | Status: DC

## 2011-03-06 MED ORDER — AMLODIPINE BESYLATE 10 MG PO TABS: 10.0000 mg | ORAL_TABLET | Freq: Every day | ORAL | Status: DC

## 2011-03-06 MED ORDER — ALPRAZOLAM 0.25 MG PO TABS: 0.2500 mg | ORAL_TABLET | Freq: Three times a day (TID) | ORAL | Status: DC | PRN

## 2011-03-06 MED ORDER — HYDROCHLOROTHIAZIDE 25 MG PO TABS: 25.0000 mg | ORAL_TABLET | Freq: Every day | ORAL | Status: DC

## 2011-03-06 MED ORDER — ESTROGENS CONJUGATED 0.45 MG PO TABS: ORAL_TABLET | ORAL | Status: DC

## 2011-03-06 NOTE — Progress Notes (Signed)
56 yo here for follow up:   HTN- Taking HCTZ 25 mg and Amlodipine 5 mg daily.  No CP, SOB, LE edema. No CP or blurred vision but over past several months. Has not taken her BP today and it is elevated. BP: 170/100 mmHg    Anxiety/depression- Doing very well on Prozac 20 mg daily. No longer seeing psychiatrist or psychologist.  Feels like she is doing well.    Patient Active Problem List  Diagnoses  . NEVUS  . TOBACCO ABUSE  . ADJUSTMENT DISORDER WITH MIXED FEATURES  . OBSTRUCTIVE SLEEP APNEA  . HYPERTENSION  . ACUTE SINUSITIS, UNSPECIFIED  . UNSPECIFIED DERMATITIS DUE TO SUN  . HYPERSOMNIA UNSPECIFIED  . FATIGUE  . SNORING  . DIARRHEA  . GERD  . ARTHRITIS, CARPOMETACARPAL JOINT, BILATERAL  . NECK PAIN  . ROTATOR CUFF SYNDROME, LEFT  . TROCHANTERIC BURSITIS, BILATERAL  . SHORTNESS OF BREATH  . OTHER DISEASES OF LUNG NOT ELSEWHERE CLASSIFIED  . Left renal mass  . Nodule of left lung   Past Medical History  Diagnosis Date  . Adjustment disorder with mixed anxiety and depressed mood   . Tobacco use disorder   . Other malaise and fatigue   . Benign neoplasm of skin, site unspecified   . Unspecified dermatitis due to sun   . Acute sinusitis, unspecified   . Unspecified essential hypertension   . Positive PPD 1987   Past Surgical History  Procedure Date  . Abdominal hysterectomy 1987   History  Substance Use Topics  . Smoking status: Current Everyday Smoker -- 1.0 packs/day for 40 years    Types: Cigarettes  . Smokeless tobacco: Not on file  . Alcohol Use: Not on file   Family History  Problem Relation Age of Onset  . Cancer Mother     melanoma  . Hyperlipidemia Father   . Hypertension Father   . Heart disease Father    Allergies  Allergen Reactions  . Ace Inhibitors     REACTION: cough   Current Outpatient Prescriptions on File Prior to Visit  Medication Sig Dispense Refill  . ALPRAZolam (XANAX) 0.25 MG tablet Take 1 tablet (0.25 mg total) by mouth 3  (three) times daily as needed.  90 tablet  0  . amLODipine (NORVASC) 5 MG tablet Take 1 tablet (5 mg total) by mouth daily.  90 tablet  0  . Armodafinil (NUVIGIL) 50 MG tablet Take 100 mg by mouth daily.        Marland Kitchen aspirin 81 MG tablet Take 81 mg by mouth daily.        Marland Kitchen estrogens, conjugated, (PREMARIN) 0.9 MG tablet Take 1 tablet (0.9 mg total) by mouth daily. Take daily for 21 days then do not take for 7 days.  90 tablet  4  . FLUoxetine (PROZAC) 20 MG capsule Take 3 capsules (60 mg total) by mouth daily.  90 capsule  1  . hydrochlorothiazide 25 MG tablet Take 1 tablet (25 mg total) by mouth daily.  90 tablet  0  . ibuprofen (ADVIL,MOTRIN) 800 MG tablet Take 1 tablet (800 mg total) by mouth 2 (two) times daily.  180 tablet  0  . omeprazole (PRILOSEC) 20 MG capsule Take 1 capsule (20 mg total) by mouth 2 (two) times daily.  180 capsule  0  . solifenacin (VESICARE) 5 MG tablet Take 2 tablets (10 mg total) by mouth daily.  90 tablet  4   The PMH, PSH, Social History, Family History, Medications,  and allergies have been reviewed in [facility], and have been updated if relevant.  Physical Exam  BP 170/100  Pulse 72  Temp(Src) 98 F (36.7 C) (Oral)  Wt 188 lb (85.276 kg)  General: Well-developed,well-nourished,in no acute distress; alert,appropriate and cooperative throughout examination  Mouth: good dentition.  Lungs: Normal respiratory effort, chest expands symmetrically. Lungs are clear to auscultation, no crackles or wheezes.  Heart: Normal rate and regular rhythm. S1 and S2 normal without gallop, murmur, click, rub or other extra sounds.  Extremities: No clubbing, cyanosis, edema, or deformity noted with normal full range of motion of all joints.  Psych: Oriented X3, memory intact for recent and remote, good eye contact, not anxious appearing, and not depressed appearing.   Assessment and Plan: 1. HYPERTENSION  Deteriorated. Will increase amlodipine to 10 mg, continue HCTZ 25 mg daily.  2.  ADJUSTMENT DISORDER WITH MIXED FEATURES  Stable.  Refilled prozac.

## 2011-03-06 NOTE — Patient Instructions (Addendum)
Good to see you. We are increasing your amlodipine to 10 mg daily. Take this along with your hydrochlorothiazide when you get home. Please come in 2 weeks to get your blood pressure rechecked.

## 2011-03-07 ENCOUNTER — Other Ambulatory Visit: Payer: Self-pay | Admitting: *Deleted

## 2011-03-07 MED ORDER — FLUOXETINE HCL 20 MG PO CAPS: 60.0000 mg | ORAL_CAPSULE | Freq: Every day | ORAL | Status: DC

## 2011-03-10 ENCOUNTER — Other Ambulatory Visit: Payer: Self-pay | Admitting: *Deleted

## 2011-03-10 MED ORDER — AMLODIPINE BESYLATE 10 MG PO TABS: 10.0000 mg | ORAL_TABLET | Freq: Every day | ORAL | Status: DC

## 2011-03-10 MED ORDER — ESTROGENS CONJUGATED 0.45 MG PO TABS: ORAL_TABLET | ORAL | Status: DC

## 2011-03-10 MED ORDER — FLUOXETINE HCL 20 MG PO CAPS: 60.0000 mg | ORAL_CAPSULE | Freq: Every day | ORAL | Status: DC

## 2011-03-10 MED ORDER — HYDROCHLOROTHIAZIDE 25 MG PO TABS: 25.0000 mg | ORAL_TABLET | Freq: Every day | ORAL | Status: DC

## 2011-03-10 MED ORDER — SOLIFENACIN SUCCINATE 5 MG PO TABS: 10.0000 mg | ORAL_TABLET | Freq: Every day | ORAL | Status: DC

## 2011-03-10 MED ORDER — OMEPRAZOLE 20 MG PO CPDR: 20.0000 mg | DELAYED_RELEASE_CAPSULE | Freq: Two times a day (BID) | ORAL | Status: DC

## 2011-03-28 ENCOUNTER — Other Ambulatory Visit: Payer: Self-pay | Admitting: *Deleted

## 2011-03-28 MED ORDER — IBUPROFEN 800 MG PO TABS: 800.0000 mg | ORAL_TABLET | Freq: Two times a day (BID) | ORAL | Status: DC

## 2011-03-28 MED ORDER — ALPRAZOLAM 0.25 MG PO TABS: 0.2500 mg | ORAL_TABLET | Freq: Three times a day (TID) | ORAL | Status: DC | PRN

## 2011-03-28 NOTE — Telephone Encounter (Signed)
Pt is asking for a 90 day supply of both meds if possible, going to [facility] mail order.

## 2011-03-29 NOTE — Telephone Encounter (Signed)
Alprazolam called to pharmacy  

## 2011-04-18 ENCOUNTER — Telehealth: Payer: Self-pay | Admitting: *Deleted

## 2011-04-18 NOTE — Telephone Encounter (Signed)
Prior Berkley Harvey is needed for nuvigil, form is on your desk.  Just needs your signature.

## 2011-04-18 NOTE — Telephone Encounter (Signed)
Signed, in my box. 

## 2011-04-18 NOTE — Telephone Encounter (Signed)
Form faxed

## 2011-04-19 NOTE — Telephone Encounter (Signed)
Prior auth also needed for vesicare, form is on your desk.

## 2011-04-19 NOTE — Telephone Encounter (Signed)
Form for vesicare faxed.

## 2011-04-19 NOTE — Telephone Encounter (Signed)
Prior auth for vesicare denied by [employer] because she has never tried oxybutynin.  I advised pt that this might happen, she is agreeable to trying something else.

## 2011-04-20 ENCOUNTER — Other Ambulatory Visit: Payer: Self-pay | Admitting: *Deleted

## 2011-04-20 MED ORDER — OXYBUTYNIN CHLORIDE ER 10 MG PO TB24: 10.0000 mg | ORAL_TABLET | Freq: Every day | ORAL | Status: DC

## 2011-04-20 MED ORDER — HYDROCHLOROTHIAZIDE 25 MG PO TABS: 25.0000 mg | ORAL_TABLET | Freq: Every day | ORAL | Status: DC

## 2011-04-20 MED ORDER — OMEPRAZOLE 20 MG PO CPDR: 20.0000 mg | DELAYED_RELEASE_CAPSULE | Freq: Two times a day (BID) | ORAL | Status: DC

## 2011-04-20 NOTE — Telephone Encounter (Signed)
Rx for oxybutinin sent to mail order.

## 2011-04-20 NOTE — Telephone Encounter (Signed)
Advised patient

## 2011-05-24 ENCOUNTER — Other Ambulatory Visit: Payer: Self-pay | Admitting: Family Medicine

## 2011-05-30 ENCOUNTER — Other Ambulatory Visit: Payer: Self-pay

## 2011-05-30 MED ORDER — FLUOXETINE HCL 20 MG PO CAPS: 60.0000 mg | ORAL_CAPSULE | Freq: Every day | ORAL | Status: DC

## 2011-05-30 NOTE — Telephone Encounter (Signed)
[facility] mail order request refill fluoxetine # [phone] x 3.

## 2011-06-05 ENCOUNTER — Other Ambulatory Visit: Payer: Self-pay | Admitting: Family Medicine

## 2011-06-05 NOTE — Telephone Encounter (Signed)
Refill for Alprazolam be sent to [facility] at fax [phone].  Pt call back is [phone].

## 2011-06-06 MED ORDER — ALPRAZOLAM 0.25 MG PO TABS: ORAL_TABLET | ORAL | Status: DC

## 2011-06-06 NOTE — Telephone Encounter (Signed)
Script faxed.

## 2011-07-12 ENCOUNTER — Other Ambulatory Visit: Payer: Self-pay | Admitting: Family Medicine

## 2011-07-13 ENCOUNTER — Inpatient Hospital Stay: Admission: RE | Admit: 2011-07-13 | Payer: 59 | Source: Ambulatory Visit

## 2011-07-13 NOTE — Telephone Encounter (Signed)
[facility]ed [facility]ill to [facility] 

## 2011-08-02 ENCOUNTER — Other Ambulatory Visit: Payer: Self-pay | Admitting: *Deleted

## 2011-08-02 MED ORDER — IBUPROFEN 800 MG PO TABS: 800.0000 mg | ORAL_TABLET | Freq: Two times a day (BID) | ORAL | Status: DC

## 2011-08-02 NOTE — Telephone Encounter (Signed)
Faxed refill request from [facility] mail order.  No last filled date given.

## 2011-08-14 ENCOUNTER — Other Ambulatory Visit: Payer: Self-pay | Admitting: *Deleted

## 2011-08-14 MED ORDER — ALPRAZOLAM 0.25 MG PO TABS: ORAL_TABLET | ORAL | Status: DC

## 2011-08-14 NOTE — Telephone Encounter (Signed)
Script faxed to [facility], fax number [phone].

## 2011-09-15 ENCOUNTER — Ambulatory Visit (INDEPENDENT_AMBULATORY_CARE_PROVIDER_SITE_OTHER): Payer: PRIVATE HEALTH INSURANCE | Admitting: Family Medicine

## 2011-09-15 ENCOUNTER — Encounter: Payer: Self-pay | Admitting: Family Medicine

## 2011-09-15 VITALS — BP 142/82 | HR 72 | Temp 98.0°F | Wt 185.0 lb

## 2011-09-15 DIAGNOSIS — Z136 Encounter for screening for cardiovascular disorders: Secondary | ICD-10-CM

## 2011-09-15 DIAGNOSIS — F4323 Adjustment disorder with mixed anxiety and depressed mood: Secondary | ICD-10-CM

## 2011-09-15 DIAGNOSIS — G4710 Hypersomnia, unspecified: Secondary | ICD-10-CM

## 2011-09-15 DIAGNOSIS — R911 Solitary pulmonary nodule: Secondary | ICD-10-CM

## 2011-09-15 DIAGNOSIS — I10 Essential (primary) hypertension: Secondary | ICD-10-CM

## 2011-09-15 DIAGNOSIS — Z1231 Encounter for screening mammogram for malignant neoplasm of breast: Secondary | ICD-10-CM

## 2011-09-15 LAB — LDL CHOLESTEROL, DIRECT: Direct LDL: 140.6 mg/dL

## 2011-09-15 LAB — COMPREHENSIVE METABOLIC PANEL
ALT: 17 U/L (ref 0–35)
AST: 17 U/L (ref 0–37)
Albumin: 4 g/dL (ref 3.5–5.2)
Alkaline Phosphatase: 79 U/L (ref 39–117)
BUN: 14 mg/dL (ref 6–23)
Calcium: 9.8 mg/dL (ref 8.4–10.5)
Chloride: 98 mEq/L (ref 96–112)
Creatinine, Ser: 1.2 mg/dL (ref 0.4–1.2)
Potassium: 3.6 mEq/L (ref 3.5–5.1)

## 2011-09-15 LAB — LIPID PANEL
Total CHOL/HDL Ratio: 3
VLDL: 34.2 mg/dL (ref 0.0–40.0)

## 2011-09-15 NOTE — Progress Notes (Signed)
56 yo here for follow up:   HTN- Taking HCTZ 25 mg and Amlodipine 10 mg daily.  No CP, SOB, LE edema. No CP or blurred vision but over past several months. Has not taken her BP today and it is elevated.    Anxiety/depression- Doing very well on Prozac 20 mg daily. No longer seeing psychiatrist or psychologist.  Feels like she is doing well.  It will be 5 years since her daughter died and she does think it gets a little better with time.  Lung nodule- missed her appointment for follow up chest CT.  She would like to reschedule it.  Patient Active Problem List  Diagnosis  . NEVUS  . TOBACCO ABUSE  . ADJUSTMENT DISORDER WITH MIXED FEATURES  . OBSTRUCTIVE SLEEP APNEA  . HYPERTENSION  . ACUTE SINUSITIS, UNSPECIFIED  . UNSPECIFIED DERMATITIS DUE TO SUN  . HYPERSOMNIA UNSPECIFIED  . FATIGUE  . SNORING  . DIARRHEA  . GERD  . ARTHRITIS, CARPOMETACARPAL JOINT, BILATERAL  . NECK PAIN  . ROTATOR CUFF SYNDROME, LEFT  . TROCHANTERIC BURSITIS, BILATERAL  . SHORTNESS OF BREATH  . OTHER DISEASES OF LUNG NOT ELSEWHERE CLASSIFIED  . Left renal mass  . Nodule of left lung   Past Medical History  Diagnosis Date  . Adjustment disorder with mixed anxiety and depressed mood   . Tobacco use disorder   . Other malaise and fatigue   . Benign neoplasm of skin, site unspecified   . Unspecified dermatitis due to sun   . Acute sinusitis, unspecified   . Unspecified essential hypertension   . Positive PPD 1987   Past Surgical History  Procedure Date  . Abdominal hysterectomy 1987   History  Substance Use Topics  . Smoking status: Current Every Day Smoker -- 1.0 packs/day for 40 years    Types: Cigarettes  . Smokeless tobacco: Not on file  . Alcohol Use: Not on file   Family History  Problem Relation Age of Onset  . Cancer Mother     melanoma  . Hyperlipidemia Father   . Hypertension Father   . Heart disease Father    Allergies  Allergen Reactions  . Ace Inhibitors     REACTION:  cough   Current Outpatient Prescriptions on File Prior to Visit  Medication Sig Dispense Refill  . ALPRAZolam (XANAX) 0.25 MG tablet TAKE 1 TABLET BY MOUTH THREE TIMES DAILY  90 tablet  0  . amLODipine (NORVASC) 10 MG tablet Take 1 tablet (10 mg total) by mouth daily.  90 tablet  3  . Armodafinil (NUVIGIL) 50 MG tablet Take 2 tablets (100 mg total) by mouth daily.  60 tablet  3  . aspirin 81 MG tablet Take 81 mg by mouth daily.        Marland Kitchen estrogens, conjugated, (PREMARIN) 0.45 MG tablet Take one tablet daily.  90 tablet  3  . FLUoxetine (PROZAC) 20 MG capsule Take 3 capsules (60 mg total) by mouth daily.  270 capsule  3  . hydrochlorothiazide (HYDRODIURIL) 25 MG tablet Take 1 tablet (25 mg total) by mouth daily.  90 tablet  3  . ibuprofen (ADVIL,MOTRIN) 800 MG tablet Take 1 tablet (800 mg total) by mouth 2 (two) times daily.  180 tablet  3  . omeprazole (PRILOSEC) 20 MG capsule Take 1 capsule (20 mg total) by mouth 2 (two) times daily.  180 capsule  3  . oxybutynin (DITROPAN-XL) 10 MG 24 hr tablet Take 1 tablet (10 mg total) by  mouth daily.  90 tablet  3  . solifenacin (VESICARE) 5 MG tablet Take 2 tablets (10 mg total) by mouth daily.  180 tablet  3   The PMH, PSH, Social History, Family History, Medications, and allergies have been reviewed in [facility], and have been updated if relevant.  Physical Exam  BP 142/82  Pulse 72  Temp 98 F (36.7 C)  Wt 185 lb (83.915 kg)  General: Well-developed,well-nourished,in no acute distress; alert,appropriate and cooperative throughout examination  Mouth: good dentition.  Lungs: Normal respiratory effort, chest expands symmetrically. Lungs are clear to auscultation, no crackles or wheezes.  Heart: Normal rate and regular rhythm. S1 and S2 normal without gallop, murmur, click, rub or other extra sounds.  Extremities: No clubbing, cyanosis, edema, or deformity noted with normal full range of motion of all joints.  Psych: Oriented X3, memory intact for recent  and remote, good eye contact, not anxious appearing, and not depressed appearing.   Assessment and Plan:  1. HYPERTENSION   Stable on current meds. CMET  2. Other screening mammogram  MM Digital Screening  3. Nodule of left lung  Set up follow up chest CT. CT Chest Wo Contrast  4. Screening for ischemic heart disease  Lipid Panel

## 2011-09-15 NOTE — Patient Instructions (Addendum)
Great to see you. Please stop by to see Marion on your way out.   

## 2011-09-18 ENCOUNTER — Ambulatory Visit: Payer: Self-pay | Admitting: Unknown Physician Specialty

## 2011-09-19 LAB — PATHOLOGY REPORT

## 2011-09-20 ENCOUNTER — Ambulatory Visit (INDEPENDENT_AMBULATORY_CARE_PROVIDER_SITE_OTHER)
Admission: RE | Admit: 2011-09-20 | Discharge: 2011-09-20 | Disposition: A | Discharge status: Home / Self Care | Payer: PRIVATE HEALTH INSURANCE | Source: Ambulatory Visit | Attending: Family Medicine | Admitting: Family Medicine

## 2011-09-20 DIAGNOSIS — R911 Solitary pulmonary nodule: Secondary | ICD-10-CM

## 2011-09-21 ENCOUNTER — Other Ambulatory Visit: Payer: Self-pay | Admitting: *Deleted

## 2011-09-21 ENCOUNTER — Other Ambulatory Visit: Payer: Self-pay | Admitting: Family Medicine

## 2011-09-21 DIAGNOSIS — R911 Solitary pulmonary nodule: Secondary | ICD-10-CM

## 2011-09-21 MED ORDER — ALPRAZOLAM 0.25 MG PO TABS: ORAL_TABLET | ORAL | Status: DC

## 2011-09-21 NOTE — Telephone Encounter (Signed)
[facility]  °

## 2011-09-21 NOTE — Telephone Encounter (Signed)
Faxed refill request from [facility] mail order is on your desk.

## 2011-09-26 ENCOUNTER — Encounter: Payer: Self-pay | Admitting: Family Medicine

## 2011-09-27 ENCOUNTER — Ambulatory Visit: Payer: Self-pay | Admitting: Family Medicine

## 2011-09-29 ENCOUNTER — Encounter: Payer: Self-pay | Admitting: Family Medicine

## 2011-09-29 ENCOUNTER — Encounter: Payer: Self-pay | Admitting: *Deleted

## 2011-09-29 LAB — HM MAMMOGRAPHY: HM Mammogram: NORMAL

## 2011-10-04 ENCOUNTER — Other Ambulatory Visit (INDEPENDENT_AMBULATORY_CARE_PROVIDER_SITE_OTHER): Payer: PRIVATE HEALTH INSURANCE

## 2011-10-04 ENCOUNTER — Ambulatory Visit (INDEPENDENT_AMBULATORY_CARE_PROVIDER_SITE_OTHER): Payer: PRIVATE HEALTH INSURANCE

## 2011-10-04 ENCOUNTER — Telehealth: Payer: Self-pay | Admitting: Radiology

## 2011-10-04 DIAGNOSIS — Z23 Encounter for immunization: Secondary | ICD-10-CM

## 2011-10-04 DIAGNOSIS — Z20820 Contact with and (suspected) exposure to varicella: Secondary | ICD-10-CM

## 2011-10-04 DIAGNOSIS — R944 Abnormal results of kidney function studies: Secondary | ICD-10-CM

## 2011-10-04 LAB — RENAL FUNCTION PANEL
BUN: 14 mg/dL (ref 6–23)
Calcium: 9.4 mg/dL (ref 8.4–10.5)
Creatinine, Ser: 0.9 mg/dL (ref 0.4–1.2)
GFR: 73.38 mL/min (ref >60.00)
Glucose, Bld: 164 mg/dL — ABNORMAL HIGH (ref 70–99)
Sodium: 133 mEq/L — ABNORMAL LOW (ref 135–145)

## 2011-10-04 NOTE — Addendum Note (Signed)
Addended by: Alvina Chou on: 10/04/2011 03:48 PM   Modules accepted: Orders

## 2011-10-04 NOTE — Addendum Note (Signed)
Addended by: Alvina Chou on: 10/04/2011 03:49 PM   Modules accepted: Orders

## 2011-10-04 NOTE — Telephone Encounter (Signed)
Ok to check titers.

## 2011-10-04 NOTE — Telephone Encounter (Signed)
Patient was here for a lab draw and asked to have a Varicella titer done to check for prior infection.I drew an extra tube of blood for it.

## 2011-10-05 ENCOUNTER — Telehealth: Payer: Self-pay | Admitting: *Deleted

## 2011-10-05 LAB — VARICELLA ZOSTER ANTIBODY, IGG: Varicella IgG: 3.37 {ISR} — ABNORMAL HIGH (ref <0.90)

## 2011-10-05 NOTE — Telephone Encounter (Signed)
Pt would like to get zostavax, since her varicella titre is postive.  Please advise if ok.

## 2011-10-05 NOTE — Telephone Encounter (Signed)
Advised patient, she will check with her insurance company regarding coverage.

## 2011-10-05 NOTE — Telephone Encounter (Signed)
Yes it's ok but her insurance likely will not cover it because she is under 60.

## 2011-10-23 ENCOUNTER — Other Ambulatory Visit: Payer: Self-pay | Admitting: *Deleted

## 2011-10-23 MED ORDER — ALPRAZOLAM 0.25 MG PO TABS: ORAL_TABLET | ORAL | Status: DC

## 2011-10-23 NOTE — Telephone Encounter (Signed)
Faxed [facility] for alprazolam.

## 2011-10-23 NOTE — Telephone Encounter (Signed)
Script faxed to [facility] order.

## 2011-12-14 ENCOUNTER — Other Ambulatory Visit: Payer: Self-pay | Admitting: *Deleted

## 2011-12-14 MED ORDER — ALPRAZOLAM 0.25 MG PO TABS: ORAL_TABLET | ORAL | Status: DC

## 2011-12-15 NOTE — Telephone Encounter (Signed)
[facility]  °

## 2012-01-26 ENCOUNTER — Other Ambulatory Visit: Payer: Self-pay | Admitting: *Deleted

## 2012-01-26 MED ORDER — IBUPROFEN 800 MG PO TABS: 800.0000 mg | ORAL_TABLET | Freq: Two times a day (BID) | ORAL | Status: DC

## 2012-01-26 MED ORDER — ALPRAZOLAM 0.25 MG PO TABS: ORAL_TABLET | ORAL | Status: DC

## 2012-01-26 MED ORDER — AMLODIPINE BESYLATE 10 MG PO TABS: 10.0000 mg | ORAL_TABLET | Freq: Every day | ORAL | Status: DC

## 2012-01-26 MED ORDER — OXYBUTYNIN CHLORIDE ER 10 MG PO TB24: 10.0000 mg | ORAL_TABLET | Freq: Every day | ORAL | Status: DC

## 2012-01-26 MED ORDER — FLUOXETINE HCL 20 MG PO CAPS: 60.0000 mg | ORAL_CAPSULE | Freq: Every day | ORAL | Status: DC

## 2012-01-26 MED ORDER — OMEPRAZOLE 20 MG PO CPDR: 20.0000 mg | DELAYED_RELEASE_CAPSULE | Freq: Two times a day (BID) | ORAL | Status: DC

## 2012-01-26 MED ORDER — ESTROGENS CONJUGATED 0.45 MG PO TABS: ORAL_TABLET | ORAL | Status: DC

## 2012-01-26 MED ORDER — HYDROCHLOROTHIAZIDE 25 MG PO TABS: 25.0000 mg | ORAL_TABLET | Freq: Every day | ORAL | Status: DC

## 2012-01-26 NOTE — Addendum Note (Signed)
Addended by: Baldomero Lamy on: 01/26/2012 09:17 AM   Modules accepted: Orders

## 2012-01-26 NOTE — Telephone Encounter (Signed)
Alprazolam called to pharmacy  

## 2012-01-26 NOTE — Telephone Encounter (Signed)
Rx requested by mail order pharmacy

## 2012-03-12 ENCOUNTER — Other Ambulatory Visit: Payer: Self-pay | Admitting: *Deleted

## 2012-03-12 MED ORDER — ALPRAZOLAM 0.25 MG PO TABS: ORAL_TABLET | ORAL | Status: DC

## 2012-03-12 NOTE — Telephone Encounter (Signed)
Script faxed to [facility]  

## 2012-03-13 ENCOUNTER — Telehealth: Payer: Self-pay | Admitting: Family Medicine

## 2012-03-13 NOTE — Telephone Encounter (Signed)
Pt called to ck on status of alprazolam refill. Advised was faxed to [facility] this morning.

## 2012-03-13 NOTE — Telephone Encounter (Signed)
Called the patient because we needed her [employer] for the 6 month FU CT Scan that was set for next Wed at [facility]. She is living in [place] taking care of her father. She said to cancel the CT and she would call back when she is back in [place] . Will call [place] and will cancel the appt and will waiti to hear from pt.

## 2012-03-13 NOTE — Telephone Encounter (Signed)
Noted  

## 2012-03-19 ENCOUNTER — Other Ambulatory Visit: Payer: Self-pay | Admitting: *Deleted

## 2012-03-19 MED ORDER — IBUPROFEN 800 MG PO TABS: 800.0000 mg | ORAL_TABLET | Freq: Two times a day (BID) | ORAL | Status: AC

## 2012-03-19 MED ORDER — FLUOXETINE HCL 20 MG PO CAPS: 60.0000 mg | ORAL_CAPSULE | Freq: Every day | ORAL | Status: DC

## 2012-03-20 ENCOUNTER — Other Ambulatory Visit: Payer: PRIVATE HEALTH INSURANCE

## 2012-04-22 ENCOUNTER — Other Ambulatory Visit: Payer: Self-pay | Admitting: *Deleted

## 2012-04-22 ENCOUNTER — Encounter: Payer: Self-pay | Admitting: *Deleted

## 2012-04-22 MED ORDER — ALPRAZOLAM 0.25 MG PO TABS: ORAL_TABLET | ORAL | Status: DC

## 2012-04-22 NOTE — Telephone Encounter (Signed)
Mail order pharmacy is requesting refills on alprazolam.

## 2012-04-23 ENCOUNTER — Other Ambulatory Visit: Payer: Self-pay | Admitting: *Deleted

## 2012-04-23 MED ORDER — HYDROCHLOROTHIAZIDE 25 MG PO TABS: 25.0000 mg | ORAL_TABLET | Freq: Every day | ORAL | Status: AC

## 2012-04-23 MED ORDER — ESTROGENS CONJUGATED 0.45 MG PO TABS: ORAL_TABLET | ORAL | Status: AC

## 2012-04-23 MED ORDER — AMLODIPINE BESYLATE 10 MG PO TABS: 10.0000 mg | ORAL_TABLET | Freq: Every day | ORAL | Status: AC

## 2012-04-23 MED ORDER — OXYBUTYNIN CHLORIDE ER 10 MG PO TB24: 10.0000 mg | ORAL_TABLET | Freq: Every day | ORAL | Status: AC

## 2012-04-23 MED ORDER — OMEPRAZOLE 20 MG PO CPDR: 20.0000 mg | DELAYED_RELEASE_CAPSULE | Freq: Two times a day (BID) | ORAL | Status: AC

## 2012-04-23 MED ORDER — FLUOXETINE HCL 20 MG PO CAPS: 60.0000 mg | ORAL_CAPSULE | Freq: Every day | ORAL | Status: AC

## 2012-04-23 NOTE — Telephone Encounter (Signed)
Script faxed to [facility]

## 2012-07-03 ENCOUNTER — Other Ambulatory Visit: Payer: Self-pay | Admitting: *Deleted

## 2012-07-03 MED ORDER — ALPRAZOLAM 0.25 MG PO TABS: ORAL_TABLET | ORAL | Status: DC

## 2012-07-03 NOTE — Telephone Encounter (Signed)
Refilled times one in PCP absence Px written for call in   

## 2012-07-03 NOTE — Telephone Encounter (Signed)
Since it's a mail order pharmacy they require a fax Rx sent to them for a controlled med I printed out the Rx and placed in your inbox for you to sign before you leave today, I will fax it tomorrow

## 2012-07-03 NOTE — Telephone Encounter (Signed)
Done and in IN box thanks 

## 2012-07-04 NOTE — Telephone Encounter (Signed)
Rx faxed to [facility]

## 2012-08-19 ENCOUNTER — Other Ambulatory Visit: Payer: Self-pay

## 2012-08-19 MED ORDER — ALPRAZOLAM 0.25 MG PO TABS: ORAL_TABLET | ORAL | Status: AC

## 2012-08-19 NOTE — Telephone Encounter (Signed)
Script printed to fax to [facility].

## 2012-08-19 NOTE — Telephone Encounter (Signed)
Pt left v/m requesting refill alprazolam to [facility].pt said [facility] has requested with no response; do not see request from [facility]. Pt request cb when filled.

## 2012-08-19 NOTE — Telephone Encounter (Signed)
Script faxed to [facility].

## 2012-11-19 ENCOUNTER — Telehealth: Payer: Self-pay

## 2012-11-19 NOTE — Telephone Encounter (Signed)
Pt left v/m requesting last mammogram report faxed to [phone]; Jenetta Downer in [place]. Jenetta Downer phone # if needed is [phone]. Pt wants to have mammogram done in [place] but [place] needs copy of last mammo first and pt request cb so pt can call to schedule appt when mammo report sent.

## 2012-11-25 NOTE — Telephone Encounter (Signed)
Pt left vm requesting status of records requested by her physician in [place].  She needs to have a Mammogram and a CT and cannot get these without the office getting her records from our office first.

## 2012-12-13 NOTE — Telephone Encounter (Signed)
Bonita Quin, can you check into this?  Thank you!

## 2013-01-28 NOTE — Telephone Encounter (Signed)
Sent last mammogram to [facility] on 01/18/13. lfw

## 2014-07-09 IMAGING — CT CT CHEST W/O CM
2 of 3 series · 15 of 36 positions shown, 18 images · non-contrast
Comparison: Chest CT 07/12/2010 and 02/11/2010.

CLINICAL DATA: Follow-up pulmonary nodules.  No acute symptoms or
history of malignancy. The patient is a smoker.

CT CHEST WITHOUT CONTRAST
TECHNIQUE: Multidetector CT imaging of the chest was performed
following the standard protocol without IV contrast.

[Series 2: chest routine with · axial · 0.76mm/px · z∈[-300,-10]mm · 12 of 70 slices shown, 15 images]
[im 6/70  mediastinal]
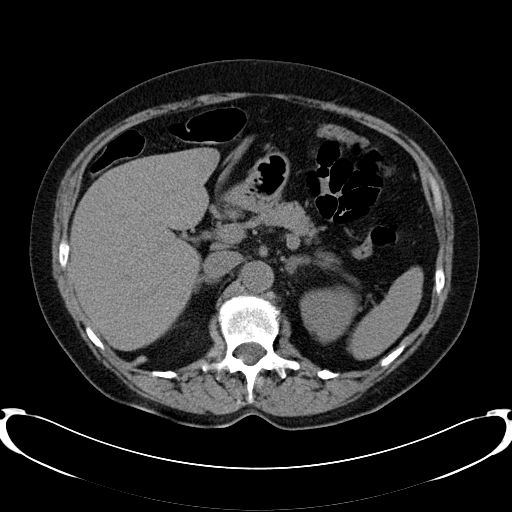
[im 6/70  lung]
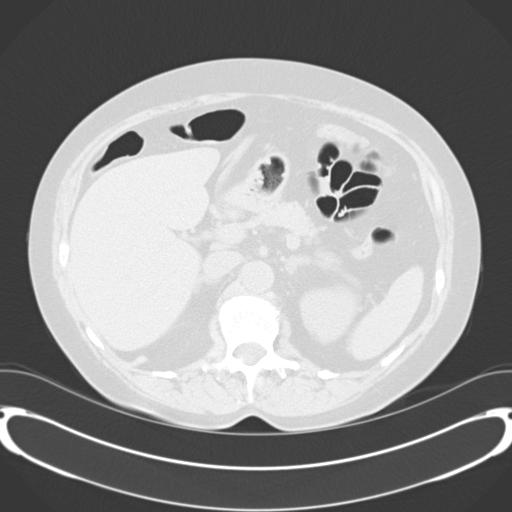
[im 11/70  lung]
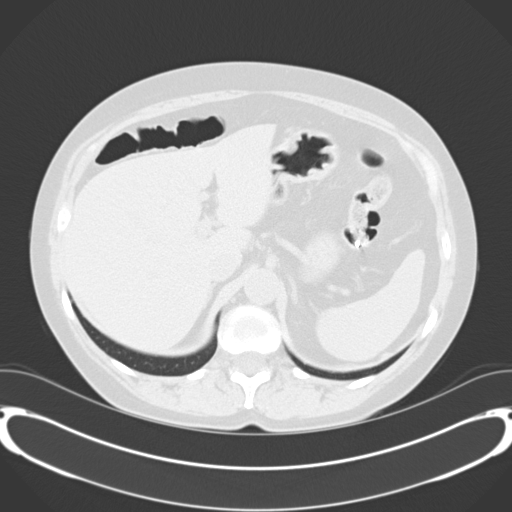
[im 16/70  lung]
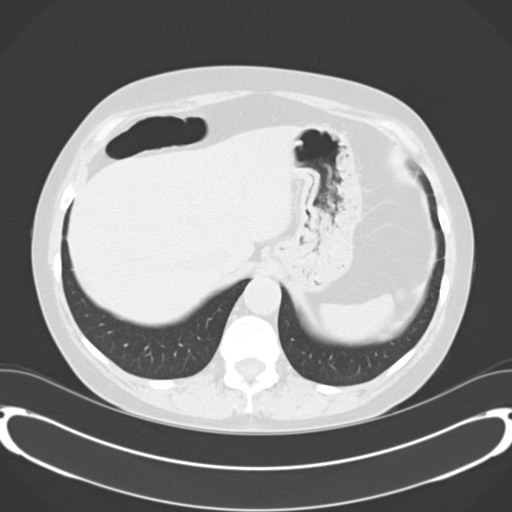
[im 21/70  lung]
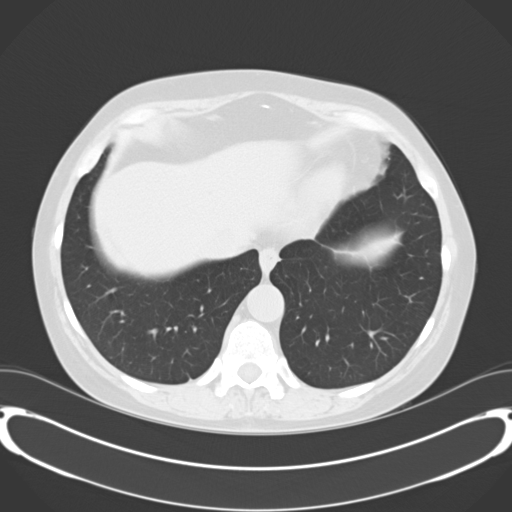
[im 26/70  mediastinal]
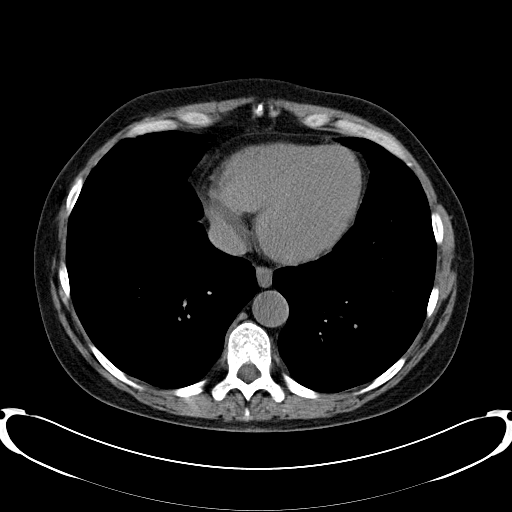
[im 26/70  lung]
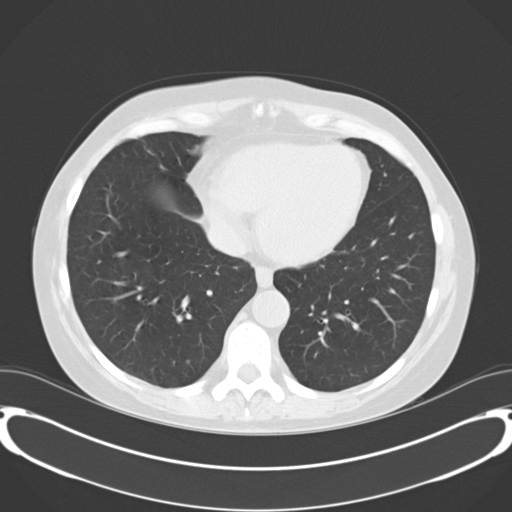
[im 31/70  lung]
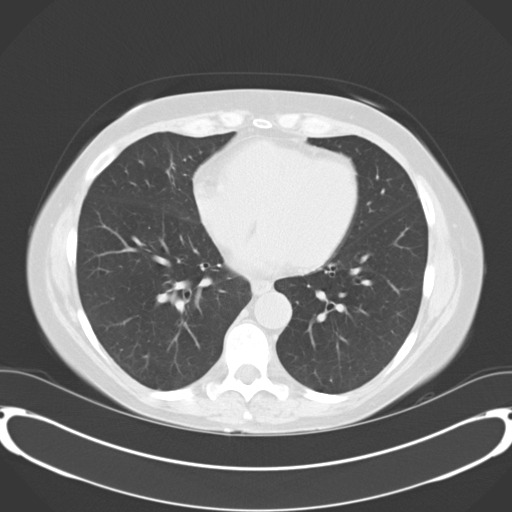
[im 39/70  lung]
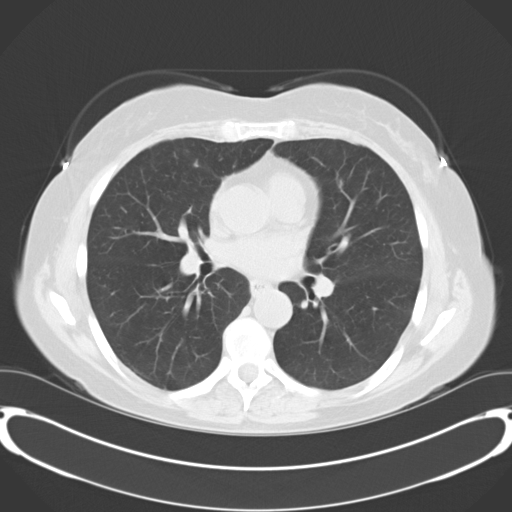
[im 44/70  lung]
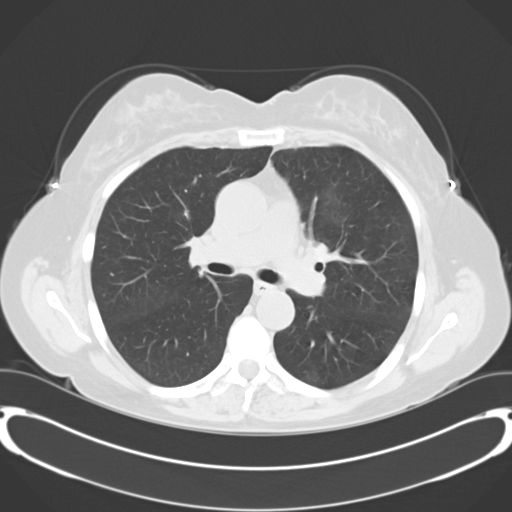
[im 49/70  mediastinal]
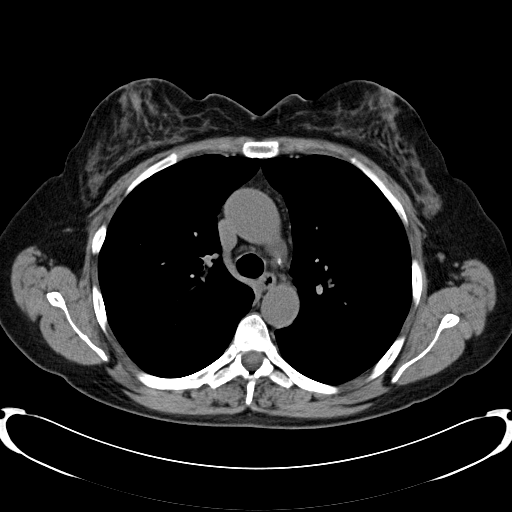
[im 49/70  lung]
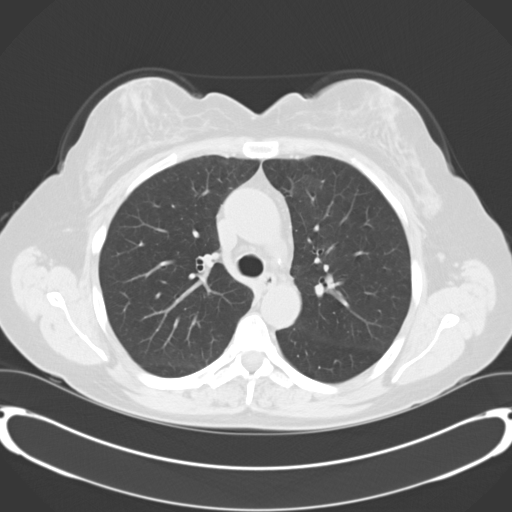
[im 54/70  lung]
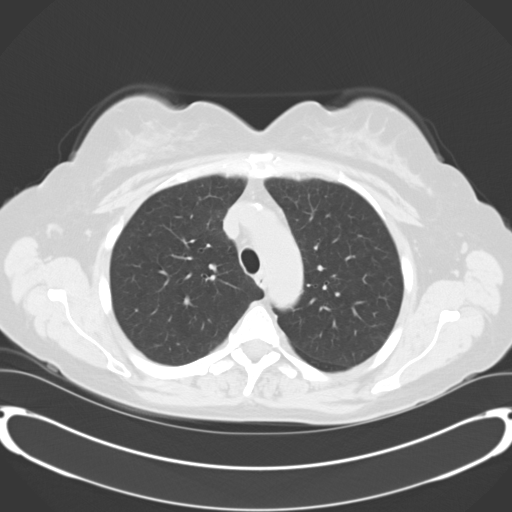
[im 59/70  lung]
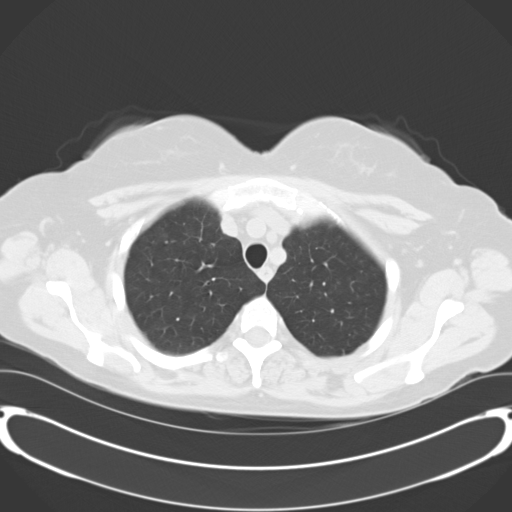
[im 64/70  lung]
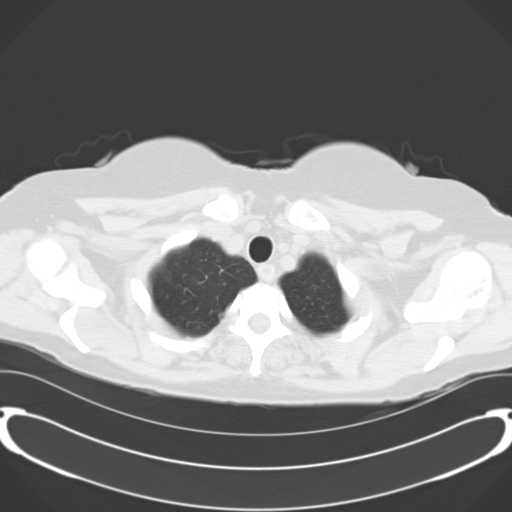

[Series 602: cor · coronal · 0.76mm/px · 3 of 108 slices shown]
[im 22/108  lung]
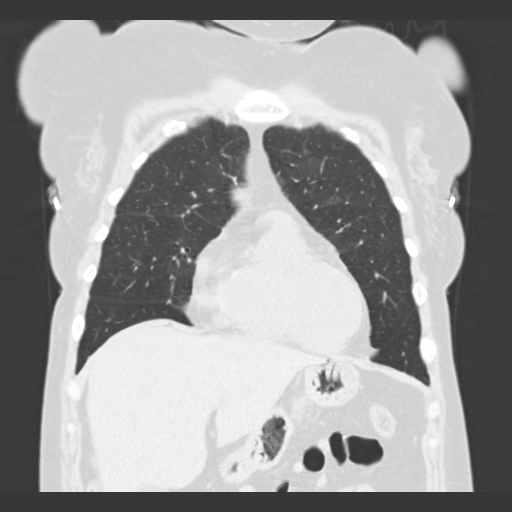
[im 43/108  lung]
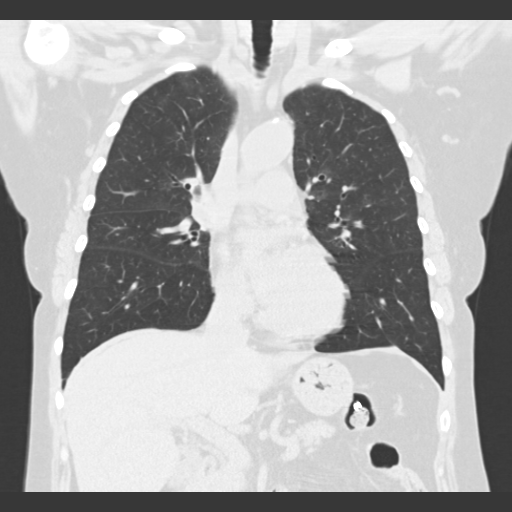
[im 65/108  lung]
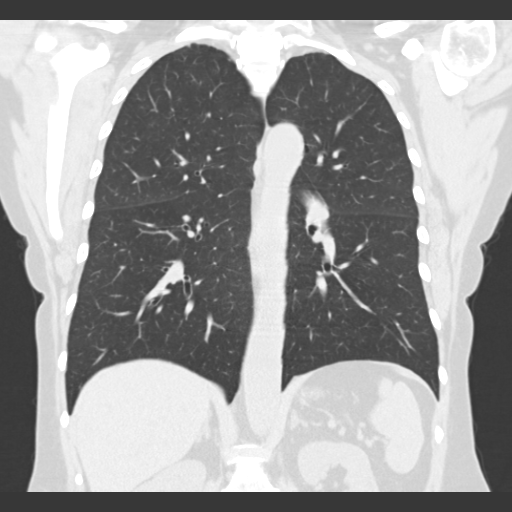

[15 of 36 positions shown; findings below may reference images not displayed]

FINDINGS: The mediastinum appears stable.  There are no
pathologically enlarged mediastinal or hilar lymph nodes.  There is
no pleural or pericardial effusion.

The 5 mm left lower lobe nodule on image 39 is stable.  Additional
tiny nodules are present within the right upper lobe on image 24,
the right middle lobe on images 36 and 37, and the left lower lobe
on image 48.  These are all stable.  There are no new or enlarging
pulmonary nodules.  Previously noted ill-defined ground-glass
density in the left perihilar region has changed its configuration
and appears nonfocal.  There is no airspace disease or
endobronchial lesion.

The visualized upper abdomen appears unremarkable.  The exophytic
lesion involving the posterior aspect of the left kidney appears
unchanged, but is incompletely visualized and characterized by this
examination.  This has been previously evaluated with MRI.
IMPRESSION: 1.  Multiple small pulmonary nodules are unchanged from the
original study of 02/11/2010.
2.  The recently described ill-defined left perihilar ground-glass
opacity is nonfocal and changed from 07/04/2010.  This is most
consistent with atelectasis or inflammation.
